# Patient Record
Sex: Male | Born: 1970 | Race: White | Hispanic: No | Marital: Married | Smoking: Never smoker
Health system: Southern US, Community
[De-identification: ages and names within clinical notes are randomized; demographics above are authoritative.]

## PROBLEM LIST (undated history)

## (undated) DIAGNOSIS — Z720 Tobacco use: Secondary | ICD-10-CM

## (undated) DIAGNOSIS — Z9889 Other specified postprocedural states: Secondary | ICD-10-CM

## (undated) DIAGNOSIS — K4090 Unilateral inguinal hernia, without obstruction or gangrene, not specified as recurrent: Secondary | ICD-10-CM

## (undated) DIAGNOSIS — Z8249 Family history of ischemic heart disease and other diseases of the circulatory system: Secondary | ICD-10-CM

## (undated) HISTORY — PX: KNEE ARTHROSCOPY W/ ACL RECONSTRUCTION: SHX1858

## (undated) HISTORY — DX: Tobacco use: Z72.0

## (undated) HISTORY — DX: Other specified postprocedural states: Z98.890

## (undated) HISTORY — DX: Family history of ischemic heart disease and other diseases of the circulatory system: Z82.49

## (undated) HISTORY — DX: Unilateral inguinal hernia, without obstruction or gangrene, not specified as recurrent: K40.90

## (undated) HISTORY — PX: KNEE ARTHROSCOPY: SUR90

## (undated) SURGERY — REPAIR, HERNIA, INGUINAL, BILATERAL, LAPAROSCOPIC
Anesthesia: General | Laterality: Bilateral

---
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
## 2010-10-28 ENCOUNTER — Ambulatory Visit: Payer: Self-pay | Admitting: Family Medicine

## 2010-11-07 ENCOUNTER — Ambulatory Visit: Payer: Self-pay | Admitting: Family Medicine

## 2016-01-25 ENCOUNTER — Emergency Department
Admission: EM | Admit: 2016-01-25 | Discharge: 2016-01-25 | Disposition: A | Discharge status: Home / Self Care | Payer: BLUE CROSS/BLUE SHIELD | Attending: Emergency Medicine | Admitting: Emergency Medicine

## 2016-01-25 ENCOUNTER — Encounter: Payer: Self-pay | Admitting: *Deleted

## 2016-01-25 DIAGNOSIS — J111 Influenza due to unidentified influenza virus with other respiratory manifestations: Secondary | ICD-10-CM | POA: Insufficient documentation

## 2016-01-25 DIAGNOSIS — R52 Pain, unspecified: Secondary | ICD-10-CM | POA: Diagnosis present

## 2016-01-25 LAB — BASIC METABOLIC PANEL
ANION GAP: 6 (ref 5–15)
BUN: 17 mg/dL (ref 6–20)
CALCIUM: 9.1 mg/dL (ref 8.9–10.3)
CO2: 26 mmol/L (ref 22–32)
Chloride: 105 mmol/L (ref 101–111)
Creatinine, Ser: 1.15 mg/dL (ref 0.61–1.24)
GLUCOSE: 113 mg/dL — AB (ref 65–99)
Potassium: 4.5 mmol/L (ref 3.5–5.1)
SODIUM: 137 mmol/L (ref 135–145)

## 2016-01-25 LAB — CBC
HCT: 48.2 % (ref 40.0–52.0)
HEMOGLOBIN: 16.4 g/dL (ref 13.0–18.0)
MCH: 32.2 pg (ref 26.0–34.0)
MCHC: 34.2 g/dL (ref 32.0–36.0)
MCV: 94.3 fL (ref 80.0–100.0)
Platelets: 211 10*3/uL (ref 150–440)
RBC: 5.11 MIL/uL (ref 4.40–5.90)
RDW: 13.1 % (ref 11.5–14.5)
WBC: 7.3 10*3/uL (ref 3.8–10.6)

## 2016-01-25 MED ORDER — OSELTAMIVIR PHOSPHATE 75 MG PO CAPS: 75.0000 mg | ORAL_CAPSULE | Freq: Two times a day (BID) | ORAL | Status: AC

## 2016-01-25 NOTE — ED Notes (Signed)
Pt then proceed to say for the past few weeks while working he has felt SOB and had a hard time getting a good deep breathe, pt in no distress at time of triage

## 2016-01-25 NOTE — ED Notes (Signed)
Pt to ed with c/o all over body aches and recent exposure to others with the flu.  Pt denies cough, denies pain at rest, but reports increased pain with movement.  VSS.  Pt wearing facemask during assessment.  In no acute resp distress at this time. Skin warm and dry.

## 2016-01-25 NOTE — ED Provider Notes (Signed)
[facility] Emergency Department Provider Note  ____________________________________________  Time seen: On arrival  I have reviewed the triage vital signs and the nursing notes.   HISTORY  Chief Complaint Generalized Body Aches    HPI Garrett Mcmahon is a 45 y.o. male who presents with complaints of myalgias, fatigue mild cough and mild sore throat. He reports he was exposed to family who recently had the flu. He denies shortness of breath. No recent travel. No nausea or vomiting.    History reviewed. No pertinent past medical history.  There are no active problems to display for this patient.   No past surgical history on file.  Current Outpatient Rx  Name  Route  Sig  Dispense  Refill  . oseltamivir (TAMIFLU) 75 MG capsule   Oral   Take 1 capsule (75 mg total) by mouth 2 (two) times daily.   10 capsule   0     Allergies Review of patient's allergies indicates no known allergies.  History reviewed. No pertinent family history.  Social History Social History  Substance Use Topics  . Smoking status: None  . Smokeless tobacco: None  . Alcohol Use: None    Review of Systems  Constitutional: Subjective fever Eyes: Negative for visual changes. ENT: Mild sore throat    Musculoskeletal: Negative for back pain. Positive for myalgias Skin: Negative for rash. Neurological: Negative for headaches or focal weakness   ____________________________________________   PHYSICAL EXAM:  VITAL SIGNS: ED Triage Vitals  Enc Vitals Group     BP 01/25/16 0938 110/71 mmHg     Pulse Rate 01/25/16 0938 86     Resp 01/25/16 0938 18     Temp 01/25/16 0938 97.6 F (36.4 C)     Temp Source 01/25/16 0938 Oral     SpO2 01/25/16 0938 99 %     Weight 01/25/16 0938 205 lb (92.987 kg)     Height 01/25/16 0938 5\' 11"  (1.803 m)     Head Cir --      Peak Flow --      Pain Score 01/25/16 0939 5     Pain Loc --      Pain Edu? --      Excl. in [place]? --       Constitutional: Alert and oriented. Well appearing and in no distress. Pleasant and interactive  Eyes: Conjunctivae are normal.  ENT   Head: Normocephalic and atraumatic.   Mouth/Throat: Mucous membranes are moist. Cardiovascular: Normal rate, regular rhythm.  Respiratory: Normal respiratory effort without tachypnea nor retractions. Clear to auscultation bilaterally Gastrointestinal: Soft and non-tender in all quadrants. No distention. There is no CVA tenderness. Musculoskeletal: Nontender with normal range of motion in all extremities. Neurologic:  Normal speech and language. No gross focal neurologic deficits are appreciated. Skin:  Skin is warm, dry and intact. No rash noted. Psychiatric: Mood and affect are normal. Patient exhibits appropriate insight and judgment.  ____________________________________________    LABS (pertinent positives/negatives)  Labs Reviewed  BASIC METABOLIC PANEL - Abnormal; Notable for the following:    Glucose, Bld 113 (*)    All other components within normal limits  CBC    ____________________________________________  ED ECG REPORT I, Lavonia Drafts, the attending physician, personally viewed and interpreted this ECG.  Date: 01/25/2016  Rate: 72 Rhythm: normal sinus rhythm QRS Axis: normal Intervals: normal ST/T Wave abnormalities: normal Conduction Disturbances: none Narrative Interpretation: unremarkable    ____________________________________________    RADIOLOGY I have personally reviewed  any xrays that were ordered on this patient: None  ____________________________________________   PROCEDURES  Procedure(s) performed: none   ____________________________________________   INITIAL IMPRESSION / ASSESSMENT AND PLAN / ED COURSE  Pertinent labs & imaging results that were available during my care of the patient were reviewed by me and considered in my medical decision making (see chart for  details).  Patient well-appearing and in no distress. Vital signs are normal. Exam is benign. Labs reassuring. History of present illness most consistent with influenza. We will treat with Tamiflu and recommend PCP follow-up. Return precautions discussed  ____________________________________________   FINAL CLINICAL IMPRESSION(S) / ED DIAGNOSES  Final diagnoses:  Influenza      Lavonia Drafts, MD 01/25/16 [phone]

## 2016-01-25 NOTE — ED Notes (Addendum)
States since Saturday pt has had no energy and body aches, denies any fever, states he visited family last week and they all had the flu

## 2016-01-25 NOTE — Discharge Instructions (Signed)

## 2016-02-23 ENCOUNTER — Emergency Department
Admission: EM | Admit: 2016-02-23 | Discharge: 2016-02-23 | Disposition: A | Discharge status: Home / Self Care | Payer: BLUE CROSS/BLUE SHIELD | Attending: Emergency Medicine | Admitting: Emergency Medicine

## 2016-02-23 ENCOUNTER — Encounter: Payer: Self-pay | Admitting: Emergency Medicine

## 2016-02-23 DIAGNOSIS — R0981 Nasal congestion: Secondary | ICD-10-CM | POA: Diagnosis present

## 2016-02-23 DIAGNOSIS — J069 Acute upper respiratory infection, unspecified: Secondary | ICD-10-CM | POA: Diagnosis not present

## 2016-02-23 MED ORDER — CHLORPHENIRAMINE MALEATE 4 MG PO TABS: 4.0000 mg | ORAL_TABLET | Freq: Two times a day (BID) | ORAL | Status: DC | PRN

## 2016-02-23 MED ORDER — GUAIFENESIN-CODEINE 100-10 MG/5ML PO SOLN: 10.0000 mL | ORAL | Status: DC | PRN

## 2016-02-23 MED ORDER — IBUPROFEN 800 MG PO TABS: 800.0000 mg | ORAL_TABLET | Freq: Three times a day (TID) | ORAL | Status: DC | PRN

## 2016-02-23 MED ORDER — AZITHROMYCIN 250 MG PO TABS: ORAL_TABLET | ORAL | Status: DC

## 2016-02-23 NOTE — ED Notes (Signed)
Reports cough and congestion.  NAD

## 2016-02-23 NOTE — ED Provider Notes (Signed)
[facility] Emergency Department Provider Note  ____________________________________________  Time seen: Approximately 10:29 AM  I have reviewed the triage vital signs and the nursing notes.   HISTORY  Chief Complaint Nasal Congestion    HPI Garrett Mcmahon is a 45 y.o. male who presents with complaints of nasal congestion and cough for the last 2 days. Patient reports no fever but has been working out in his yard and thought at first it was allergies but it continued to get worse. States his cough is productive. Complains of having a sore throat and body aches all over. Denies any fever or chills.   History reviewed. No pertinent past medical history.  There are no active problems to display for this patient.   History reviewed. No pertinent past surgical history.  Current Outpatient Rx  Name  Route  Sig  Dispense  Refill  . azithromycin (ZITHROMAX Z-PAK) 250 MG tablet      Take 2 tablets (500 mg) on  Day 1,  followed by 1 tablet (250 mg) once daily on Days 2 through 5.   6 each   0   . chlorpheniramine (CHLOR-TRIMETON) 4 MG tablet   Oral   Take 1 tablet (4 mg total) by mouth 2 (two) times daily as needed for allergies or rhinitis.   30 tablet   0   . guaiFENesin-codeine 100-10 MG/5ML syrup   Oral   Take 10 mLs by mouth every 4 (four) hours as needed for cough.   180 mL   0   . ibuprofen (ADVIL,MOTRIN) 800 MG tablet   Oral   Take 1 tablet (800 mg total) by mouth every 8 (eight) hours as needed.   30 tablet   0     Allergies Review of patient's allergies indicates no known allergies.  No family history on file.  Social History Social History  Substance Use Topics  . Smoking status: Never Smoker   . Smokeless tobacco: None  . Alcohol Use: None    Review of Systems Constitutional: No fever/chills Eyes: No visual changes. ENT: Positive sore throat. Cardiovascular: Denies chest pain. Respiratory: Denies shortness of breath.  Positive for coughing Gastrointestinal: No abdominal pain.  No nausea, no vomiting.  No diarrhea.  No constipation. Genitourinary: Negative for dysuria. Musculoskeletal: Positive for generalized body aches all over. Skin: Negative for rash. Neurological: Negative for headaches, focal weakness or numbness.  10-point ROS otherwise negative.  ____________________________________________   PHYSICAL EXAM:  VITAL SIGNS: ED Triage Vitals  Enc Vitals Group     BP 02/23/16 0935 121/71 mmHg     Pulse Rate 02/23/16 0935 57     Resp 02/23/16 0935 15     Temp 02/23/16 0935 98.3 F (36.8 C)     Temp Source 02/23/16 0935 Oral     SpO2 02/23/16 0935 100 %     Weight 02/23/16 0935 200 lb (90.719 kg)     Height 02/23/16 0935 5\' 11"  (1.803 m)     Head Cir --      Peak Flow --      Pain Score 02/23/16 0936 0     Pain Loc --      Pain Edu? --      Excl. in [place]? --     Constitutional: Alert and oriented. Well appearing and in no acute distress. Eyes: Conjunctivae are normal. PERRL. EOMI. Head: Atraumatic. Nose: Minimal congestion/rhinnorhea. Mouth/Throat: Mucous membranes are moist.  Oropharynx non-erythematous. Neck: No stridor.  Full range of  motion nontender. No cervical adenopathy noted. Cardiovascular: Normal rate, regular rhythm. Grossly normal heart sounds.  Good peripheral circulation. Respiratory: Normal respiratory effort.  No retractions. Lungs CTAB. Musculoskeletal: No lower extremity tenderness nor edema.  No joint effusions. Neurologic:  Normal speech and language. No gross focal neurologic deficits are appreciated. No gait instability. Skin:  Skin is warm, dry and intact. No rash noted. Psychiatric: Mood and affect are normal. Speech and behavior are normal.  ____________________________________________   LABS (all labs ordered are listed, but only abnormal results are displayed)  Labs Reviewed - No data to display    PROCEDURES  Procedure(s) performed:  None  Critical Care performed: No  ____________________________________________   INITIAL IMPRESSION / ASSESSMENT AND PLAN / ED COURSE  Pertinent labs & imaging results that were available during my care of the patient were reviewed by me and considered in my medical decision making (see chart for details).  Acute URI. Rx given for Robitussin-AC, chlorpheniramine, Motrin 800 mg 3 times a day. Patient follow-up with PCP or return to the ER with any worsening symptomology. Patient voices no other emergency medical complaints at this time. Work excuse 48 hours given. ____________________________________________   FINAL CLINICAL IMPRESSION(S) / ED DIAGNOSES  Final diagnoses:  Acute URI     This chart was dictated using voice recognition software/Dragon. Despite best efforts to proofread, errors can occur which can change the meaning. Any change was purely unintentional.   Arlyss Repress, PA-C 02/23/16 [place], MD 02/23/16 [phone]

## 2016-02-23 NOTE — ED Notes (Signed)
Having nasal congestion and cough for couple of days. Pt is afebrile on arrival   NAD noted

## 2016-02-23 NOTE — Discharge Instructions (Signed)
Upper Respiratory Infection, Adult Most upper respiratory infections (URIs) are a viral infection of the air passages leading to the lungs. A URI affects the nose, throat, and upper air passages. The most common type of URI is nasopharyngitis and is typically referred to as "the common cold." URIs run their course and usually go away on their own. Most of the time, a URI does not require medical attention, but sometimes a bacterial infection in the upper airways can follow a viral infection. This is called a secondary infection. Sinus and middle ear infections are common types of secondary upper respiratory infections. Bacterial pneumonia can also complicate a URI. A URI can worsen asthma and chronic obstructive pulmonary disease (COPD). Sometimes, these complications can require emergency medical care and may be life threatening.  CAUSES Almost all URIs are caused by viruses. A virus is a type of germ and can spread from one person to another.  RISKS FACTORS You may be at risk for a URI if:   You smoke.   You have chronic heart or lung disease.  You have a weakened defense (immune) system.   You are very young or very old.   You have nasal allergies or asthma.  You work in crowded or poorly ventilated areas.  You work in health care facilities or schools. SIGNS AND SYMPTOMS  Symptoms typically develop 2-3 days after you come in contact with a cold virus. Most viral URIs last 7-10 days. However, viral URIs from the influenza virus (flu virus) can last 14-18 days and are typically more severe. Symptoms may include:   Runny or stuffy (congested) nose.   Sneezing.   Cough.   Sore throat.   Headache.   Fatigue.   Fever.   Loss of appetite.   Pain in your forehead, behind your eyes, and over your cheekbones (sinus pain).  Muscle aches.  DIAGNOSIS  Your health care provider may diagnose a URI by:  Physical exam.  Tests to check that your symptoms are not due to  another condition such as:  Strep throat.  Sinusitis.  Pneumonia.  Asthma. TREATMENT  A URI goes away on its own with time. It cannot be cured with medicines, but medicines may be prescribed or recommended to relieve symptoms. Medicines may help:  Reduce your fever.  Reduce your cough.  Relieve nasal congestion. HOME CARE INSTRUCTIONS   Take medicines only as directed by your health care provider.   Gargle warm saltwater or take cough drops to comfort your throat as directed by your health care provider.  Use a warm mist humidifier or inhale steam from a shower to increase air moisture. This may make it easier to breathe.  Drink enough fluid to keep your urine clear or pale yellow.   Eat soups and other clear broths and maintain good nutrition.   Rest as needed.   Return to work when your temperature has returned to normal or as your health care provider advises. You may need to stay home longer to avoid infecting others. You can also use a face mask and careful hand washing to prevent spread of the virus.  Increase the usage of your inhaler if you have asthma.   Do not use any tobacco products, including cigarettes, chewing tobacco, or electronic cigarettes. If you need help quitting, ask your health care provider. PREVENTION  The best way to protect yourself from getting a cold is to practice good hygiene.   Avoid oral or hand contact with people with cold   symptoms.   Wash your hands often if contact occurs.  There is no clear evidence that vitamin C, vitamin E, echinacea, or exercise reduces the chance of developing a cold. However, it is always recommended to get plenty of rest, exercise, and practice good nutrition.  SEEK MEDICAL CARE IF:   You are getting worse rather than better.   Your symptoms are not controlled by medicine.   You have chills.  You have worsening shortness of breath.  You have brown or red mucus.  You have yellow or brown nasal  discharge.  You have pain in your face, especially when you bend forward.  You have a fever.  You have swollen neck glands.  You have pain while swallowing.  You have white areas in the back of your throat. SEEK IMMEDIATE MEDICAL CARE IF:   You have severe or persistent:  Headache.  Ear pain.  Sinus pain.  Chest pain.  You have chronic lung disease and any of the following:  Wheezing.  Prolonged cough.  Coughing up blood.  A change in your usual mucus.  You have a stiff neck.  You have changes in your:  Vision.  Hearing.  Thinking.  Mood. MAKE SURE YOU:   Understand these instructions.  Will watch your condition.  Will get help right away if you are not doing well or get worse.   This information is not intended to replace advice given to you by your health care provider. Make sure you discuss any questions you have with your health care provider.   Document Released: 04/19/2001 Document Revised: 03/10/2015 Document Reviewed: 01/29/2014 Elsevier Interactive Patient Education 2016 [employer]  

## 2016-06-06 ENCOUNTER — Emergency Department
Admission: EM | Admit: 2016-06-06 | Discharge: 2016-06-06 | Disposition: A | Discharge status: Home / Self Care | Payer: BLUE CROSS/BLUE SHIELD | Attending: Emergency Medicine | Admitting: Emergency Medicine

## 2016-06-06 ENCOUNTER — Encounter: Payer: Self-pay | Admitting: Emergency Medicine

## 2016-06-06 DIAGNOSIS — J069 Acute upper respiratory infection, unspecified: Secondary | ICD-10-CM | POA: Insufficient documentation

## 2016-06-06 DIAGNOSIS — J029 Acute pharyngitis, unspecified: Secondary | ICD-10-CM | POA: Diagnosis present

## 2016-06-06 MED ORDER — ONDANSETRON HCL 4 MG/2ML IJ SOLN
INTRAMUSCULAR | Status: AC
  Filled 2016-06-06: qty 2

## 2016-06-06 MED ORDER — CETIRIZINE HCL 10 MG PO CAPS: 10.0000 mg | ORAL_CAPSULE | Freq: Every day | ORAL | 3 refills | Status: DC

## 2016-06-06 MED ORDER — KETOROLAC TROMETHAMINE 30 MG/ML IJ SOLN
INTRAMUSCULAR | Status: AC
  Filled 2016-06-06: qty 1

## 2016-06-06 MED ORDER — GUAIFENESIN-CODEINE 100-10 MG/5ML PO SYRP: 5.0000 mL | ORAL_SOLUTION | Freq: Three times a day (TID) | ORAL | 0 refills | Status: DC | PRN

## 2016-06-06 NOTE — Discharge Instructions (Signed)
Rest today. Increase fluid intake. Return to the ER for symptoms that change or worsen if unable to schedule an appointment with primary care.

## 2016-06-06 NOTE — ED Triage Notes (Signed)
Pt presents with sore throat and nasal congestion for two days.

## 2016-06-06 NOTE — ED Provider Notes (Signed)
[facility] Emergency Department Provider Note  ____________________________________________  Time seen: Approximately 9:14 AM  I have reviewed the triage vital signs and the nursing notes.   HISTORY  Chief Complaint Sore Throat   HPI Garrett Mcmahon is a 45 y.o. male who presents to the emergency department for evaluation of sore throat, cough, body aches, nasal congestion, and postnasal drip x 2 days.He has taken some DayQuil with moderate relief of his symptoms.   History reviewed. No pertinent past medical history.  There are no active problems to display for this patient.   History reviewed. No pertinent surgical history.  Current Outpatient Rx  . Order #: [id number] Class: Print  . Order #: [id number] Class: Print  . Order #: [id number] Class: Print  . Order #: [id number] Class: Print  . Order #: [id number] Class: Print    Allergies Review of patient's allergies indicates no known allergies.  No family history on file.  Social History Social History  Substance Use Topics  . Smoking status: Never Smoker  . Smokeless tobacco: Not on file  . Alcohol use Not on file    Review of Systems Constitutional: No fever/chills ENT: Positive sore throat. Cardiovascular: Denies chest pain. Respiratory: Negative for shortness of breath. Positive for cough. Gastrointestinal: Negative for nausea,  no vomiting.  Negative for diarrhea.  Musculoskeletal: Positive for body aches Skin: Negative for rash. Neurological: Negative for headaches ____________________________________________   PHYSICAL EXAM:  VITAL SIGNS: ED Triage Vitals  Enc Vitals Group     BP 06/06/16 0847 117/65     Pulse Rate 06/06/16 0847 62     Resp 06/06/16 0847 18     Temp 06/06/16 0847 97.7 F (36.5 C)     Temp Source 06/06/16 0847 Oral     SpO2 06/06/16 0847 98 %     Weight 06/06/16 0847 200 lb (90.7 kg)     Height 06/06/16 0847 5\' 10"  (1.778 m)     Head Circumference --    Peak Flow --      Pain Score 06/06/16 0848 7     Pain Loc --      Pain Edu? --      Excl. in [place]? --     Constitutional: Alert and oriented. Well appearing and in no acute distress. Eyes: Conjunctivae are normal. EOMI. Ears: TM normal bilaterally. Nose: Diffuse sinus congestion; clear rhinnorhea. Mouth/Throat: Mucous membranes are moist.  Oropharynx erythematous. Tonsils appear 2+ without exudate. Neck: No stridor.  Lymphatic: No cervical lymphadenopathy. Cardiovascular: Normal rate, regular rhythm. Grossly normal heart sounds.  Good peripheral circulation. Respiratory: Normal respiratory effort.  No retractions. Clear to auscultation throughout. Gastrointestinal: Soft and nontender.  Musculoskeletal: FROM x 4 extremities.  Neurologic:  Normal speech and language.  Skin:  Skin is warm, dry and intact. No rash noted. Psychiatric: Mood and affect are normal. Speech and behavior are normal.  ____________________________________________   LABS (all labs ordered are listed, but only abnormal results are displayed)  Labs Reviewed - No data to display ____________________________________________  EKG   ____________________________________________  RADIOLOGY  Not indicated ____________________________________________   PROCEDURES  Procedure(s) performed: None  Critical Care performed: No  ____________________________________________   INITIAL IMPRESSION / ASSESSMENT AND PLAN / ED COURSE  Pertinent labs & imaging results that were available during my care of the patient were reviewed by me and considered in my medical decision making (see chart for details).   Patient was advised to take the cetirizine and Robitussin AC as prescribed. He was advised  to follow-up with primary care provider for his choice for symptoms that are not improving over the next 2-3 days. He was instructed to return to the emergency department for symptoms that change or worsen if he is unable  schedule an appointment. ____________________________________________   FINAL CLINICAL IMPRESSION(S) / ED DIAGNOSES  Final diagnoses:  URI (upper respiratory infection)    Note:  This document was prepared using Dragon voice recognition software and may include unintentional dictation errors.     Victorino Dike, FNP 06/07/16 1617    Har[place] Dark, MD 06/09/16 [phone]

## 2017-01-18 DIAGNOSIS — Z8249 Family history of ischemic heart disease and other diseases of the circulatory system: Secondary | ICD-10-CM

## 2017-01-18 DIAGNOSIS — F17200 Nicotine dependence, unspecified, uncomplicated: Secondary | ICD-10-CM | POA: Insufficient documentation

## 2017-01-18 DIAGNOSIS — K4090 Unilateral inguinal hernia, without obstruction or gangrene, not specified as recurrent: Secondary | ICD-10-CM | POA: Insufficient documentation

## 2017-01-18 HISTORY — DX: Family history of ischemic heart disease and other diseases of the circulatory system: Z82.49

## 2017-01-19 ENCOUNTER — Encounter: Payer: Self-pay | Admitting: Surgery

## 2017-01-19 ENCOUNTER — Ambulatory Visit (INDEPENDENT_AMBULATORY_CARE_PROVIDER_SITE_OTHER): Payer: BLUE CROSS/BLUE SHIELD | Admitting: Surgery

## 2017-01-19 VITALS — BP 121/85 | HR 90 | Temp 98.3°F | Wt 220.0 lb

## 2017-01-19 DIAGNOSIS — K4091 Unilateral inguinal hernia, without obstruction or gangrene, recurrent: Secondary | ICD-10-CM | POA: Diagnosis not present

## 2017-01-19 DIAGNOSIS — K4090 Unilateral inguinal hernia, without obstruction or gangrene, not specified as recurrent: Secondary | ICD-10-CM

## 2017-01-19 NOTE — Patient Instructions (Addendum)
Give [place] a call in case you decide on doing your surgery soon.  Please look at your blue sheet in case you have any questions or concerns about your surgery.

## 2017-01-19 NOTE — Progress Notes (Signed)
Garrett Mcmahon is an 46 y.o. male.   Chief Complaint: RIH HPI:   Past Medical History:  Diagnosis Date  . Chronic elbow pain, right   . Chronic left shoulder pain   . Family history of MI (myocardial infarction) 01/18/2017   Age- 56's  . Inguinal hernia 1971   Repaired at 2 months old   . Tobacco use     Past Surgical History:  Procedure Laterality Date  . INGUINAL HERNIA REPAIR Right 1971   Repaired at 73 months old  . KNEE ARTHROSCOPY Right    x2  . KNEE ARTHROSCOPY W/ ACL RECONSTRUCTION Right     Family History  Problem Relation Age of Onset  . COPD Mother   . Bipolar disorder Father   . Heart attack Maternal Grandmother   . Breast cancer Maternal Grandmother   . Heart disease Paternal Grandfather   . Heart attack Paternal Grandfather   . Lung cancer Paternal Grandfather     Smoker   Social History:  reports that he has never smoked. His smokeless tobacco use includes Chew. He reports that he does not drink alcohol or use drugs. Works at [employer]  Allergies: No Known Allergies   (Not in a hospital admission)   Review of Systems:   Review of Systems  Constitutional: Negative.   HENT: Negative.   Eyes: Negative.   Respiratory: Negative.   Cardiovascular: Negative.   Gastrointestinal: Negative.   Genitourinary: Negative.   Musculoskeletal: Negative.   Skin: Negative.   Neurological: Negative.   Endo/Heme/Allergies: Negative.   Psychiatric/Behavioral: Negative.     Physical Exam:  Physical Exam  Constitutional: He is oriented to person, place, and time and well-developed, well-nourished, and in no distress. No distress.  HENT:  Head: Normocephalic and atraumatic.  Eyes: Pupils are equal, round, and reactive to light. Right eye exhibits no discharge. Left eye exhibits no discharge. No scleral icterus.  Neck: Normal range of motion.  Cardiovascular: Normal rate, regular rhythm and normal heart sounds.   Pulmonary/Chest: Effort normal and breath sounds  normal. No respiratory distress. He has no wheezes.  Abdominal: Soft. He exhibits no distension. There is no tenderness. There is no rebound and no guarding.  Genitourinary: Penis normal.  Genitourinary Comments: Normal testicles Patient examined standing supine and with Valsalva. Large right inguinal hernia probably direct which is easily reducible and nontender Smaller left inguinal hernia but certainly palpable and nontender  Musculoskeletal: Normal range of motion. He exhibits no edema.  Lymphadenopathy:    He has no cervical adenopathy.  Neurological: He is alert and oriented to person, place, and time.  Skin: Skin is warm and dry. No rash noted. He is not diaphoretic. No erythema.  Psychiatric: Mood and affect normal.  Vitals reviewed.   There were no vitals taken for this visit.    No results found for this or any previous visit (from the past 48 hour(s)). No results found.   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 He had a repaired as a baby. I would recommend laparoscopic preperitoneal repair of bilateral inguinal hernias for control of his symptoms. The rationale for this been discussed the options of observation versus open procedure were reviewed and the risk bleeding infection recurrence ischemic orchitis chronic pain syndrome and neuroma were all discussed he understood and agreed to proceed Patient is out of vacation time and may want to delay this but he will talk to his supervisor. Florene Glen, MD, FACS

## 2017-02-09 ENCOUNTER — Encounter: Payer: Self-pay | Admitting: General Practice

## 2017-10-26 ENCOUNTER — Telehealth: Payer: Self-pay | Admitting: General Practice

## 2017-10-26 ENCOUNTER — Ambulatory Visit: Payer: Self-pay | Admitting: Surgery

## 2017-10-26 NOTE — Telephone Encounter (Signed)
Left a message for the patient to call the office, patient no showed appointment on 10/26/17 with Dr. Burt Knack for a Follow Up: Right Inguinal Hernia-discuss surgery date. Please r/s if the patient calls back.

## 2017-10-27 NOTE — Telephone Encounter (Signed)
Mailed a letter to patient to call the office to r/s her no showed appointment.

## 2018-10-23 ENCOUNTER — Encounter (HOSPITAL_COMMUNITY): Payer: Self-pay | Admitting: *Deleted

## 2018-10-23 ENCOUNTER — Other Ambulatory Visit: Payer: Self-pay

## 2018-10-23 ENCOUNTER — Ambulatory Visit (HOSPITAL_COMMUNITY)
Admission: EM | Admit: 2018-10-23 | Discharge: 2018-10-23 | Disposition: A | Discharge status: Home / Self Care | Payer: BLUE CROSS/BLUE SHIELD | Attending: Family Medicine | Admitting: Family Medicine

## 2018-10-23 DIAGNOSIS — J40 Bronchitis, not specified as acute or chronic: Secondary | ICD-10-CM | POA: Insufficient documentation

## 2018-10-23 MED ORDER — HYDROCODONE-HOMATROPINE 5-1.5 MG/5ML PO SYRP: 5.0000 mL | ORAL_SOLUTION | Freq: Four times a day (QID) | ORAL | 0 refills | Status: DC | PRN

## 2018-10-23 MED ORDER — PREDNISONE 20 MG PO TABS: ORAL_TABLET | ORAL | 0 refills | Status: DC

## 2018-10-23 NOTE — ED Triage Notes (Signed)
C/o cough x 1 week.

## 2018-10-23 NOTE — Discharge Instructions (Signed)
Please return if you are not significantly better in 48 hours or if you start running a fever.

## 2018-10-23 NOTE — ED Provider Notes (Signed)
[place]    CSN: [id number] Arrival date & time: 10/23/18  1305     History   Chief Complaint Chief Complaint  Patient presents with  . Cough    HPI Garrett Mcmahon is a 47 y.o. male.   This is an initial visit for this 47 year old gentleman.  He comes in complaining with cough x 1 week.  He has had no smoking  or history of asthma.  His cough is somewhat productive and worse at night.  He has had no fever.  Patient has no shortness of breath or chest pain.     Past Medical History:  Diagnosis Date  . Chronic elbow pain, right   . Chronic left shoulder pain   . Family history of MI (myocardial infarction) 01/18/2017   Age- 55's  . Inguinal hernia 1971   Repaired at 2 months old   . Tobacco use     Patient Active Problem List   Diagnosis Date Noted  . Right inguinal hernia 01/18/2017  . Tobacco use disorder 01/18/2017  . Family history of MI (myocardial infarction) 01/18/2017    Past Surgical History:  Procedure Laterality Date  . INGUINAL HERNIA REPAIR Right 1971   Repaired at 88 months old  . KNEE ARTHROSCOPY Right    x2  . KNEE ARTHROSCOPY W/ ACL RECONSTRUCTION Right        Home Medications    Prior to Admission medications   Medication Sig Start Date End Date Taking? Authorizing Provider  acetaminophen (TYLENOL) 325 MG tablet Take 650 mg by mouth every 6 (six) hours as needed.    [provider]  HYDROcodone-homatropine (HYDROMET) 5-1.5 MG/5ML syrup Take 5 mLs by mouth every 6 (six) hours as needed for cough. 10/23/18   Robyn Haber, MD  predniSONE (DELTASONE) 20 MG tablet Two daily with food 10/23/18   Robyn Haber, MD    Family History Family History  Problem Relation Age of Onset  . COPD Mother   . Bipolar disorder Father   . Alcohol abuse Father   . Heart attack Maternal Grandmother   . Breast cancer Maternal Grandmother   . Heart disease Maternal Grandmother   . Heart disease Paternal Grandfather   . Heart  attack Paternal Grandfather   . Lung cancer Paternal Grandfather        Smoker  . Cancer Paternal Grandfather        Lung  . Heart disease Maternal Uncle   . Cancer Paternal Grandmother        breast    Social History Social History   Tobacco Use  . Smoking status: Never Smoker  . Smokeless tobacco: Current User    Types: Chew  Substance Use Topics  . Alcohol use: No  . Drug use: No     Allergies   Patient has no known allergies.   Review of Systems Review of Systems   Physical Exam Triage Vital Signs ED Triage Vitals  Enc Vitals Group     BP 10/23/18 1341 128/87     Pulse Rate 10/23/18 1341 77     Resp 10/23/18 1341 16     Temp 10/23/18 1341 97.9 F (36.6 C)     Temp Source 10/23/18 1341 Oral     SpO2 10/23/18 1341 97 %     Weight --      Height --      Head Circumference --      Peak Flow --  Pain Score 10/23/18 1343 0     Pain Loc --      Pain Edu? --      Excl. in [place]? --    No data found.  Updated Vital Signs BP 128/87 (BP Location: Right Arm)   Pulse 77   Temp 97.9 F (36.6 C) (Oral)   Resp 16   SpO2 97%    Physical Exam Vitals signs and nursing note reviewed.  Constitutional:      General: He is not in acute distress.    Appearance: Normal appearance. He is not ill-appearing.  HENT:     Head: Normocephalic.     Right Ear: Tympanic membrane and external ear normal.     Left Ear: Tympanic membrane and external ear normal.     Nose: Nose normal.     Mouth/Throat:     Mouth: Mucous membranes are moist.  Eyes:     Conjunctiva/sclera: Conjunctivae normal.  Neck:     Musculoskeletal: Normal range of motion.  Cardiovascular:     Rate and Rhythm: Normal rate.     Heart sounds: Normal heart sounds.  Pulmonary:     Effort: Pulmonary effort is normal.     Breath sounds: Rhonchi present. No wheezing.  Musculoskeletal: Normal range of motion.  Skin:    General: Skin is warm and dry.  Neurological:     General: No focal deficit  present.     Mental Status: He is alert.      UC Treatments / Results  Labs (all labs ordered are listed, but only abnormal results are displayed) Labs Reviewed - No data to display  EKG None  Radiology No results found.  Procedures Procedures (including critical care time)  Medications Ordered in UC Medications - No data to display  Initial Impression / Assessment and Plan / UC Course  I have reviewed the triage vital signs and the nursing notes.  Pertinent labs & imaging results that were available during my care of the patient were reviewed by me and considered in my medical decision making (see chart for details).    Final Clinical Impressions(s) / UC Diagnoses   Final diagnoses:  Bronchitis     Discharge Instructions     Please return if you are not significantly better in 48 hours or if you start running a fever.    ED Prescriptions    Medication Sig Dispense Auth. Provider   predniSONE (DELTASONE) 20 MG tablet Two daily with food 10 tablet Robyn Haber, MD   HYDROcodone-homatropine (HYDROMET) 5-1.5 MG/5ML syrup Take 5 mLs by mouth every 6 (six) hours as needed for cough. 60 mL Robyn Haber, MD     Controlled Substance Prescriptions [place] Controlled Substance Registry consulted? Not Applicable   Robyn Haber, MD 10/23/18 1404

## 2018-12-24 ENCOUNTER — Other Ambulatory Visit: Payer: Self-pay

## 2018-12-24 ENCOUNTER — Encounter: Payer: Self-pay | Admitting: Family Medicine

## 2018-12-24 ENCOUNTER — Other Ambulatory Visit: Payer: Self-pay | Admitting: Family Medicine

## 2018-12-24 ENCOUNTER — Ambulatory Visit (INDEPENDENT_AMBULATORY_CARE_PROVIDER_SITE_OTHER): Payer: BLUE CROSS/BLUE SHIELD | Admitting: Family Medicine

## 2018-12-24 VITALS — BP 118/81 | HR 79 | Temp 97.7°F | Resp 16 | Ht 71.0 in | Wt 226.8 lb

## 2018-12-24 DIAGNOSIS — F17200 Nicotine dependence, unspecified, uncomplicated: Secondary | ICD-10-CM

## 2018-12-24 DIAGNOSIS — Z125 Encounter for screening for malignant neoplasm of prostate: Secondary | ICD-10-CM

## 2018-12-24 DIAGNOSIS — Z114 Encounter for screening for human immunodeficiency virus [HIV]: Secondary | ICD-10-CM

## 2018-12-24 DIAGNOSIS — Z0000 Encounter for general adult medical examination without abnormal findings: Secondary | ICD-10-CM

## 2018-12-24 DIAGNOSIS — R7309 Other abnormal glucose: Secondary | ICD-10-CM

## 2018-12-24 DIAGNOSIS — E669 Obesity, unspecified: Secondary | ICD-10-CM

## 2018-12-24 DIAGNOSIS — Z7689 Persons encountering health services in other specified circumstances: Secondary | ICD-10-CM | POA: Diagnosis not present

## 2018-12-24 DIAGNOSIS — H65492 Other chronic nonsuppurative otitis media, left ear: Secondary | ICD-10-CM

## 2018-12-24 DIAGNOSIS — H6982 Other specified disorders of Eustachian tube, left ear: Secondary | ICD-10-CM

## 2018-12-24 DIAGNOSIS — K4021 Bilateral inguinal hernia, without obstruction or gangrene, recurrent: Secondary | ICD-10-CM | POA: Diagnosis not present

## 2018-12-24 DIAGNOSIS — E66811 Obesity, class 1: Secondary | ICD-10-CM

## 2018-12-24 DIAGNOSIS — Z9889 Other specified postprocedural states: Secondary | ICD-10-CM | POA: Diagnosis not present

## 2018-12-24 MED ORDER — PREDNISONE 20 MG PO TABS: 20.0000 mg | ORAL_TABLET | Freq: Every day | ORAL | 0 refills | Status: DC

## 2018-12-24 MED ORDER — FLUTICASONE PROPIONATE 50 MCG/ACT NA SUSP: 2.0000 | Freq: Every day | NASAL | 3 refills | Status: DC

## 2018-12-24 NOTE — Assessment & Plan Note (Signed)
Continues daily dip smokeless tobacco Follow-up as needed when ready for counseling cessation

## 2018-12-24 NOTE — Assessment & Plan Note (Addendum)
Stable chronic problem since childhood Without report of complication  Reassurance given no symptomatic episodes or complications Trial of Truss OTC prevent worsening symptoms and herniation Follow-up at next visit -for closer exam, and future consider refer Gen Surg for possible repair when ready Strict return criteria given if worsening episode when to go to hospital

## 2018-12-24 NOTE — Progress Notes (Signed)
Subjective:    Patient ID: Garrett Mcmahon, male    DOB: [birth year], 48 y.o.   MRN: [id number]  Garrett Mcmahon is a 48 y.o. male presenting on 12/24/2018 for Establish Care (ear infection left ear intermitten since 11/19) and Shoulder Pain (s/p L shoulder rotator cuff repair)  No previous PCP recently. Here to establish care.  HPI   S/p Left Shoulder Surgery, rotator cuff repair Prior history reviewed. He works for Programmer, multimedia, he was working with [employer], he had injury on the job, seen by [employer] Dr Amparo Bristol, then worker's comp switched him to Dr Noemi Chapel ([facility]) and he was seen and had surgery in 11/21/18, L shoulder rotator cuff repair - for medicine, he has Oxycodone 5 IR but has never taken - Taking Cyclobenzaprine 5mg  PRN muscle relaxant - Taking Celebrex 200mg  PRN pain Denies any significant worsening pain, swelling, other joint problem, injury trauma fall  L Ear Eustachian Tube Dysfunction w/ Effusion / Reduced Hearing / History of Left Ear Infection Reports recent history in December 2019 he had L ear infection, with pain, seen at Urgent Care and treated with antibiotic course, and then symptoms returned, and he was treated again with different antibiotic and again worked with good results. But it never seemed to resolve. 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 He has some protrusion without any pain or obstruction at times, worse if cough or strain he can feel it, but it is easily reducible. 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  Health Maintenance Due for routine HIV screen, has had before, will get with next lab No known fam history of colon or prostate cancer. Interested in cologuard, and PSA lab test. Declines flu vaccine.  Depression screen PHQ 2/9 12/24/2018  Decreased Interest 0  Down, Depressed, Hopeless 0  PHQ - 2 Score 0    Past Medical History:  Diagnosis Date  . Family history of MI (myocardial infarction) 01/18/2017   Age- 2's  . H/O shoulder surgery   . Inguinal hernia 1971   Repaired at 2 months old   . Tobacco use    Past Surgical History:  Procedure Laterality Date  . INGUINAL HERNIA REPAIR Right 1971   Repaired at 14 months old  . KNEE ARTHROSCOPY Right    x2  . KNEE ARTHROSCOPY W/ ACL RECONSTRUCTION Right    Social History   Socioeconomic History  . Marital status: Married    Spouse name: Not on file  . Number of children: Not on file  . Years of education: [employer]  . Highest education level: High school graduate  Occupational History  . 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 Financial resource strain: Not on file  . Food insecurity:    Worry: Not on file    Inability: Not on file  . Transportation needs:    Medical: Not on file  Non-medical: Not on file  Tobacco Use  . Smoking status: Never Smoker  . Smokeless tobacco: Current User    Types: Chew  . Tobacco comment: 1 can dip per day for 26+ years  Substance and Sexual Activity  . Alcohol use: No  . Drug use: No  . Sexual activity: Not on file  Lifestyle  . Physical activity:    Days per week: Not on file    Minutes per session: Not on file  . Stress: Not on file  Relationships  . Social connections:    Talks on phone: Not on file    Gets together: Not on file    Attends religious service: Not on file    Active member of club or organization:  Not on file    Attends meetings of clubs or organizations: Not on file    Relationship status: Not on file  . Intimate partner violence:    Fear of current or ex partner: Not on file    Emotionally abused: Not on file    Physically abused: Not on file    Forced sexual activity: Not on file  Other Topics Concern  . Not on file  Social History Narrative  . Not on file   Family History  Problem Relation Age of Onset  . COPD Mother   . Bipolar disorder Father   . Alcohol abuse Father   . Heart attack Maternal Grandmother   . Breast cancer Maternal Grandmother   . Heart disease Maternal Grandmother   . Heart disease Paternal Grandfather   . Heart attack Paternal Grandfather   . Lung cancer Paternal Grandfather        Smoker  . Cancer Paternal Grandfather        Lung  . Heart disease Maternal Uncle   . Cancer Paternal Grandmother        breast  . Prostate cancer Neg Hx   . Colon cancer Neg Hx    Current Outpatient Medications on File Prior to Visit  Medication Sig  . acetaminophen (TYLENOL) 325 MG tablet Take 650 mg by mouth every 6 (six) hours as needed.  . celecoxib (CELEBREX) 200 MG capsule PLEASE SEE ATTACHED FOR DETAILED DIRECTIONS  . cyclobenzaprine (FLEXERIL) 5 MG tablet    No current facility-administered medications on file prior to visit.     Review of Systems Per HPI unless specifically indicated above     Objective:    BP 118/81   Pulse 79   Temp 97.7 F (36.5 C) (Oral)   Resp 16   Ht 5\' 11"  (1.803 m)   Wt 226 lb 12.8 oz (102.9 kg)   BMI 31.63 kg/m   Wt Readings from Last 3 Encounters:  12/24/18 226 lb 12.8 oz (102.9 kg)  01/19/17 220 lb (99.8 kg)  06/06/16 200 lb (90.7 kg)    Physical Exam Vitals signs and nursing note reviewed.  Constitutional:      General: He is not in acute distress.    Appearance: He is well-developed. He is not diaphoretic.     Comments: Well-appearing, comfortable, cooperative  HENT:     Head: Normocephalic and  atraumatic.     Comments: Frontal / maxillary sinuses non-tender. Nares patent without purulence or edema. R TMs clear without erythema, effusion or bulging. L TM with some fullness and slightly opaque to clear effusion noticeable, no erythema, some abnormal TM anatomy but does not appear perforation. Oropharynx clear without erythema, exudates, edema or asymmetry. Eyes:  General:        Right eye: No discharge.        Left eye: No discharge.     Conjunctiva/sclera: Conjunctivae normal.  Neck:     Musculoskeletal: Normal range of motion and neck supple.     Thyroid: No thyromegaly.  Cardiovascular:     Rate and Rhythm: Normal rate and regular rhythm.     Heart sounds: Normal heart sounds. No murmur.  Pulmonary:     Effort: Pulmonary effort is normal. No respiratory distress.     Breath sounds: Normal breath sounds. No wheezing or rales.  Musculoskeletal:     Comments: Left upper extremity in sling, not evaluated.  Lymphadenopathy:     Cervical: No cervical adenopathy.  Skin:    General: Skin is warm and dry.     Findings: No erythema or rash.  Neurological:     Mental Status: He is alert and oriented to person, place, and time.  Psychiatric:        Behavior: Behavior normal.     Comments: Well groomed, good eye contact, normal speech and thoughts    Results for orders placed or performed during the hospital encounter of 16/01/09  Basic metabolic panel  Result Value Ref Range   Sodium 137 135 - 145 mmol/L   Potassium 4.5 3.5 - 5.1 mmol/L   Chloride 105 101 - 111 mmol/L   CO2 26 22 - 32 mmol/L   Glucose, Bld 113 (H) 65 - 99 mg/dL   BUN 17 6 - 20 mg/dL   Creatinine, Ser 1.15 0.61 - 1.24 mg/dL   Calcium 9.1 8.9 - 10.3 mg/dL   GFR calc non Af Amer >60 >60 mL/min   GFR calc Af Amer >60 >60 mL/min   Anion gap 6 5 - 15  CBC  Result Value Ref Range   WBC 7.3 3.8 - 10.6 K/uL   RBC 5.11 4.40 - 5.90 MIL/uL   Hemoglobin 16.4 13.0 - 18.0 g/dL   HCT 48.2 40.0 - 52.0 %   MCV 94.3  80.0 - 100.0 fL   MCH 32.2 26.0 - 34.0 pg   MCHC 34.2 32.0 - 36.0 g/dL   RDW 13.1 11.5 - 14.5 %   Platelets 211 150 - 440 K/uL      Assessment & Plan:   Problem List Items Addressed This Visit    Bilateral inguinal hernia, without obstruction or gangrene, recurrent    Stable chronic problem since childhood Without report of complication  Reassurance given no symptomatic episodes or complications Trial of Truss OTC prevent worsening symptoms and herniation Follow-up at next visit -for closer exam, and future consider refer Gen Surg for possible repair when ready Strict return criteria given if worsening episode when to go to hospital      Obesity (BMI 30.0-34.9) - Primary    Stable weight Encourage improve lifestyle diet exercise      S/P rotator cuff surgery    Followed by Dr Noemi Chapel [facility]) worker's comp S/p surgery 11/2018 Request records  Contiues on NSAID, muscle relaxant PRN      Tobacco use disorder    Continues daily dip smokeless tobacco Follow-up as needed when ready for counseling cessation       Other Visit Diagnoses    Encounter to establish care with new doctor       Eustachian tube dysfunction, left       Relevant Medications   fluticasone (FLONASE) 50 MCG/ACT nasal spray   predniSONE (  DELTASONE) 20 MG tablet   Chronic MEE (middle ear effusion), left       Relevant Medications   fluticasone (FLONASE) 50 MCG/ACT nasal spray   predniSONE (DELTASONE) 20 MG tablet      Subacute on chronic >1-2 months, L eustachian tube dysfunction with secondary L ear effusion, with mild hearing loss. Resolved AOM. No sign of infection at this time, completed antibiotics  Plan: 1. Start Flonase 2 sprays each nare daily for up to 4-6 weeks or longer 2. Start OTC oral anti-histamine daily 3. Recommend may consider trial OTC oral decongestant for up to 1 week or less 4. Recommended per [facility]S Galbreath Technique today. Advised can use warm moist heat in his  area and may try this technique as demonstrated 1-2x daily 2-3 days then stop, avoid over-use 5. Additionally discussed potential benefit of oral steroid, prednisone for short course up to 20mg  daily x 5 days 6. Follow-up if not improved or worsening, return criteria  Next may need ENT if not resolved   Meds ordered this encounter  Medications  . fluticasone (FLONASE) 50 MCG/ACT nasal spray    Sig: Place 2 sprays into both nostrils daily. Use for 4-6 weeks then stop and use seasonally or as needed.    Dispense:  16 g    Refill:  3  . predniSONE (DELTASONE) 20 MG tablet    Sig: Take 1 tablet (20 mg total) by mouth daily with breakfast. For 5 days    Dispense:  5 tablet    Refill:  0    Follow up plan: Return in about 2 months (around 02/22/2019) for Annual Physical.   Future physical labs 03/07/19  Nobie Putnam, [facility] 12/24/2018, 10:36 AM

## 2018-12-24 NOTE — Patient Instructions (Addendum)
Thank you for coming to the office today.  Keep with Orthopedics - we will request record from Dr Noemi Chapel  ---------------  Garrett Mcmahon have some Eustachian Tube Dysfunction, this problem is usually caused by some deeper sinus swelling and pressure, causing difficulty of eustachian tubes to clear fluid from behind ear drum. You can have ear pain, pressure, fullness, loss of hearing. Often related to sinus symptoms and sometimes with sinusitis or infection or allergy symptoms.  Treatment: - Repeat prednisone trial for steroid reduce swelling - Start using nasal steroid spray, Flonase 2 sprays in each nostril every day for at least 4-6 weeks, and maybe longer - MAY start OTC allergy medicine (Claritin, Zyrtec, or Allegra - or generics) once daily - Recommend BEHIND THE COUNTER - Sudafed decongestant as needed for a week or more - AVOID Afrin nasal spray, if you use this do not use more than 3 days or can make sinus swelling worse  Look into "Galbreath Technique" for ear effusion, can do this simple massage 1-2 x daily for few days then stop. And use as needed. May also use a warm moist wash cloth to help loosen up this tissue before or after doing this to help open up eustachian tube.   If any significant worsening, loss of hearing, constant pain, fever/chills, or concern for infection - notify office and we can send in an antibiotic. Or if just persistent pressure that is not improving, you may contact me back within 1 week and we can consider a brief course of oral steroid prednisone for 3 days only to help reduce swelling, as discussed this is not ideal treatment and can cause side effects.  -------------------------------------  You most likely have an Right > Left Inguinal Hernia.  This is caused by a weakness in your abdominal or groin muscles, and is caused by bowel or fatty tissue pushing through this weak spot causing pain and bulging.  Recommend a "Hernia Truss", try to wear this regularly  (do not need to wear to bed if pain is improved), until it feels better and then only with activities - If it is not improving then you may need to wear it every day, especially if you cannot have a surgery to fix the hernia  You can find a Truss OTC at [facility] or pharmacy  Try to find positions that give you most relief, likely laying down with head lower than body will allow the hernia bulging to go back into place and feel better.  May take Tylenol as needed. Recommend to start taking Tylenol Extra Strength 582m tabs - take 1 to 2 tabs per dose (max 10066m every 6-8 hours for pain (take regularly, don't skip a dose for next 7 days), max 24 hour daily dose is 6 tablets or 300074mIn the future you can repeat the same everyday Tylenol course for 1-2 weeks at a time.   Can try topical ice packs or muscle rub if burning nerve sensation.  If significant worsening pain or you get bulging that does NOT go down or go away and CANNOT push back in, or nausea, vomiting, then it is very important to go directly to hospital ED for more immediate evaluation, as this can be a life threatening surgical emergency  DUE for FASTING BLOOD WORK (no food or drink after midnight before the lab appointment, only water or coffee without cream/sugar on the morning of)  SCHEDULE "Lab Only" visit in the morning at the clinic for lab draw in  [place]  2-3  - Make sure Lab Only appointment is at about 1 week before your next appointment, so that results will be available  For Lab Results, once available within 2-3 days of blood draw, you can can log in to MyChart online to view your results and a brief explanation. Also, we can discuss results at next follow-up visit.  ----------  Colon Cancer Screening: - For all adults age 12+ routine colon cancer screening is highly recommended.     - Recent guidelines from [place] recommend starting age of 45 - Early detection of colon cancer is important,  because often there are no warning signs or symptoms, also if found early usually it can be cured. Late stage is hard to treat.  - If you are not interested in Colonoscopy screening (if done and normal you could be cleared for 5 to 10 years until next due), then Cologuard is an excellent alternative for screening test for Colon Cancer. It is highly sensitive for detecting DNA of colon cancer from even the earliest stages. Also, there is NO bowel prep required. - If Cologuard is NEGATIVE, then it is good for 3 years before next due - If Cologuard is POSITIVE, then it is strongly advised to get a Colonoscopy, which allows the GI doctor to locate the source of the cancer or polyp (even very early stage) and treat it by removing it. ------------------------- If you would like to proceed with Cologuard (stool DNA test) - FIRST, call your insurance company and tell them you want to check cost of Cologuard tell them CPT Code [phone] (it may be completely covered and you could get for no cost, OR max cost without any coverage is about $600). Also, keep in mind if you do NOT open the kit, and decide not to do the test, you will NOT be charged, you should contact the company if you decide not to do the test. - If you want to proceed, you can notify us (phone message, [place], or at next visit) and we will order it for you. The test kit will be delivered to you house within about 1 week. Follow instructions to collect sample, you may call the company for any help or questions, 24/7 telephone support at [phone].   Please schedule a Follow-up Appointment to: Return in about 2 months (around 02/22/2019) for Annual Physical.  If you have any other questions or concerns, please feel free to call the office or send a message through [place]. You may also schedule an earlier appointment if necessary.  Additionally, you may be receiving a survey about your experience at our office within a few days to 1 week  by e-mail or mail. We value your feedback.  Garrett Putnam, DO [place]

## 2018-12-24 NOTE — Assessment & Plan Note (Signed)
Stable weight Encourage improve lifestyle diet exercise

## 2018-12-24 NOTE — Assessment & Plan Note (Signed)
Followed by Dr Noemi Chapel [facility]) worker's comp S/p surgery 11/2018 Request records  Contiues on NSAID, muscle relaxant PRN

## 2019-03-07 ENCOUNTER — Other Ambulatory Visit: Payer: BLUE CROSS/BLUE SHIELD

## 2019-03-14 ENCOUNTER — Encounter: Payer: BLUE CROSS/BLUE SHIELD | Admitting: Family Medicine

## 2019-04-03 ENCOUNTER — Other Ambulatory Visit: Payer: BLUE CROSS/BLUE SHIELD

## 2019-04-05 LAB — CBC WITH DIFFERENTIAL/PLATELET
Absolute Monocytes: 776 cells/uL (ref 200–950)
Basophils Absolute: 40 cells/uL (ref 0–200)
Basophils Relative: 0.5 %
Eosinophils Absolute: 312 cells/uL (ref 15–500)
Eosinophils Relative: 3.9 %
HCT: 47.2 % (ref 38.5–50.0)
Hemoglobin: 16.2 g/dL (ref 13.2–17.1)
Lymphs Abs: 3000 cells/uL (ref 850–3900)
MCH: 32.7 pg (ref 27.0–33.0)
MCHC: 34.3 g/dL (ref 32.0–36.0)
MCV: 95.2 fL (ref 80.0–100.0)
MPV: 10.7 fL (ref 7.5–12.5)
Monocytes Relative: 9.7 %
Neutro Abs: 3872 cells/uL (ref 1500–7800)
Neutrophils Relative %: 48.4 %
Platelets: 236 10*3/uL (ref 140–400)
RBC: 4.96 10*6/uL (ref 4.20–5.80)
RDW: 12.8 % (ref 11.0–15.0)
Total Lymphocyte: 37.5 %
WBC: 8 10*3/uL (ref 3.8–10.8)

## 2019-04-05 LAB — HEMOGLOBIN A1C
Hgb A1c MFr Bld: 5.3 % of total Hgb (ref <5.7)
Mean Plasma Glucose: 105 (calc)
eAG (mmol/L): 5.8 (calc)

## 2019-04-05 LAB — COMPLETE METABOLIC PANEL WITH GFR
AG Ratio: 1.6 (calc) (ref 1.0–2.5)
ALT: 15 U/L (ref 9–46)
AST: 13 U/L (ref 10–40)
Albumin: 4.2 g/dL (ref 3.6–5.1)
Alkaline phosphatase (APISO): 56 U/L (ref 36–130)
BUN: 14 mg/dL (ref 7–25)
CO2: 25 mmol/L (ref 20–32)
Calcium: 9.5 mg/dL (ref 8.6–10.3)
Chloride: 104 mmol/L (ref 98–110)
Creat: 1.13 mg/dL (ref 0.60–1.35)
GFR, Est African American: 88 mL/min/{1.73_m2} (ref >=60)
GFR, Est Non African American: 76 mL/min/{1.73_m2} (ref >=60)
Globulin: 2.6 g/dL (calc) (ref 1.9–3.7)
Glucose, Bld: 82 mg/dL (ref 65–99)
Potassium: 4.2 mmol/L (ref 3.5–5.3)
Sodium: 140 mmol/L (ref 135–146)
Total Bilirubin: 0.7 mg/dL (ref 0.2–1.2)
Total Protein: 6.8 g/dL (ref 6.1–8.1)

## 2019-04-05 LAB — LIPID PANEL
Cholesterol: 209 mg/dL — ABNORMAL HIGH (ref <200)
HDL: 50 mg/dL (ref >=40)
LDL Cholesterol (Calc): 135 mg/dL (calc) — ABNORMAL HIGH
Non-HDL Cholesterol (Calc): 159 mg/dL (calc) — ABNORMAL HIGH (ref <130)
Total CHOL/HDL Ratio: 4.2 (calc) (ref <5.0)
Triglycerides: 127 mg/dL (ref <150)

## 2019-04-05 LAB — PSA: PSA: 0.2 ng/mL (ref <=4.0)

## 2019-04-05 LAB — HIV ANTIBODY (ROUTINE TESTING W REFLEX): HIV 1&2 Ab, 4th Generation: NONREACTIVE

## 2019-04-10 ENCOUNTER — Other Ambulatory Visit: Payer: Self-pay

## 2019-04-10 ENCOUNTER — Ambulatory Visit (INDEPENDENT_AMBULATORY_CARE_PROVIDER_SITE_OTHER): Payer: BLUE CROSS/BLUE SHIELD | Admitting: Family Medicine

## 2019-04-10 ENCOUNTER — Encounter: Payer: Self-pay | Admitting: Family Medicine

## 2019-04-10 ENCOUNTER — Other Ambulatory Visit: Payer: Self-pay | Admitting: Family Medicine

## 2019-04-10 VITALS — BP 103/66 | HR 72 | Temp 97.9°F | Ht 71.0 in | Wt 229.0 lb

## 2019-04-10 DIAGNOSIS — Z1211 Encounter for screening for malignant neoplasm of colon: Secondary | ICD-10-CM | POA: Diagnosis not present

## 2019-04-10 DIAGNOSIS — E669 Obesity, unspecified: Secondary | ICD-10-CM

## 2019-04-10 DIAGNOSIS — Z0000 Encounter for general adult medical examination without abnormal findings: Secondary | ICD-10-CM

## 2019-04-10 DIAGNOSIS — E7800 Pure hypercholesterolemia, unspecified: Secondary | ICD-10-CM

## 2019-04-10 DIAGNOSIS — E785 Hyperlipidemia, unspecified: Secondary | ICD-10-CM | POA: Insufficient documentation

## 2019-04-10 NOTE — Progress Notes (Signed)
Subjective:    Patient ID: Garrett Mcmahon, male    DOB: 1971-04-02, 48 y.o.   MRN: [id number]  Garrett Mcmahon is a 48 y.o. male presenting on 04/10/2019 for Annual Exam   HPI   Here for Annual Physical and Lab Review  LIFESTYLE / WELLNESS / HYPERLIPIDEMIA / OBESITY BMI >31 - Reports no new concerns. Last lipid panel 03/2019, mostly normal except mild elevated LDL - Never on cholesterol med Lifestyle - Admits few lb weight gain in >3-6 months, attributed to some change in lifestyle with coronavirus pandemic - Diet: not following strict diet, he eats mostly chicken, fairly balanced - Exercise: improving exercise plans   PMH: - S/p Left Shoulder Surgery, rotator cuff repair. He remains out of work still on leave due to this surgery. 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 Currently asymptomatic. No known family history of prostate CA.   Colon CA Screening: Never had colonoscopy. Currently asymptomatic. No known family history of colon CA. Due for screening test Cologuard, counseling given    Depression screen [facility] 2/9 12/24/2018  Decreased Interest 0  Down, Depressed, Hopeless 0  PHQ - 2 Score 0    Past Medical History:  Diagnosis Date  . Family history of MI (myocardial infarction) 01/18/2017   Age- 5's  . H/O shoulder surgery   . Inguinal hernia 1971   Repaired at 2 months old   . Tobacco use    Past Surgical History:  Procedure Laterality Date  . INGUINAL HERNIA REPAIR Right 1971   Repaired at 8 months old  . KNEE ARTHROSCOPY Right    x2  . KNEE ARTHROSCOPY W/ ACL RECONSTRUCTION Right    Social History   Socioeconomic History  . Marital status: Married    Spouse name: Not on file  . Number of children: Not on file  . Years of education: [employer]  . Highest education level: High school  graduate  Occupational History  . 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 Financial resource strain: Not on file  . Food insecurity:    Worry: Not on file    Inability: Not on file  . Transportation needs:    Medical: Not on file    Non-medical: Not on file  Tobacco Use  . Smoking status: Never Smoker  . Smokeless tobacco: Current User    Types: Chew  . Tobacco comment: 1 can dip per day for 26+ years  Substance and Sexual Activity  . Alcohol use: No  . Drug use: No  . Sexual activity: Not on file  Lifestyle  . Physical activity:    Days per week: Not on file    Minutes per session: Not on file  . Stress: Not on file  Relationships  . Social connections:    Talks on phone: Not on file    Gets together: Not on file    Attends religious service: Not on file    Active member of club or organization: Not on file    Attends meetings of clubs or organizations: Not on file    Relationship status: Not on file  . Intimate partner violence:    Fear of current or ex partner: Not on file    Emotionally abused: Not on file    Physically abused: Not on file  Forced sexual activity: Not on file  Other Topics Concern  . Not on file  Social History Narrative  . Not on file   Family History  Problem Relation Age of Onset  . COPD Mother   . Bipolar disorder Father   . Alcohol abuse Father   . Heart attack Maternal Grandmother   . Breast cancer Maternal Grandmother   . Heart disease Maternal Grandmother   . Heart disease Paternal Grandfather   . Heart attack Paternal Grandfather   . Lung cancer Paternal Grandfather        Smoker  . Cancer Paternal Grandfather        Lung  . Heart disease Maternal Uncle   . Cancer Paternal Grandmother        breast  . Prostate cancer Neg Hx   . Colon cancer Neg Hx    Current Outpatient Medications on File Prior to Visit  Medication Sig  . acetaminophen (TYLENOL) 325 MG tablet Take 650 mg by mouth every 6 (six) hours as needed.   . celecoxib (CELEBREX) 200 MG capsule PLEASE SEE ATTACHED FOR DETAILED DIRECTIONS  . fluticasone (FLONASE) 50 MCG/ACT nasal spray Place 2 sprays into both nostrils daily. Use for 4-6 weeks then stop and use seasonally or as needed.   No current facility-administered medications on file prior to visit.     Review of Systems  Constitutional: Negative for activity change, appetite change, chills, diaphoresis, fatigue and fever.  HENT: Negative for congestion and hearing loss.   Eyes: Negative for visual disturbance.  Respiratory: Negative for apnea, cough, chest tightness, shortness of breath and wheezing.   Cardiovascular: Negative for chest pain, palpitations and leg swelling.  Gastrointestinal: Negative for abdominal pain, anal bleeding, blood in stool, constipation, diarrhea, nausea and vomiting.  Endocrine: Negative for cold intolerance.  Genitourinary: Negative for decreased urine volume, difficulty urinating, dysuria, frequency and hematuria.  Musculoskeletal: Negative for arthralgias, back pain and neck pain.  Skin: Negative for rash.  Allergic/Immunologic: Negative for environmental allergies.  Neurological: Negative for dizziness, weakness, light-headedness, numbness and headaches.  Hematological: Negative for adenopathy.  Psychiatric/Behavioral: Negative for behavioral problems, dysphoric mood and sleep disturbance. The patient is not nervous/anxious.    Per HPI unless specifically indicated above     Objective:    BP 103/66 (BP Location: Left Arm, Patient Position: Sitting, Cuff Size: Normal)   Pulse 72   Temp 97.9 F (36.6 C) (Oral)   Ht 5\' 11"  (1.803 m)   Wt 229 lb (103.9 kg)   BMI 31.94 kg/m   Wt Readings from Last 3 Encounters:  04/10/19 229 lb (103.9 kg)  12/24/18 226 lb 12.8 oz (102.9 kg)  01/19/17 220 lb (99.8 kg)    Physical Exam Vitals signs and nursing note reviewed.  Constitutional:      General: He is not in acute distress.    Appearance: He is  well-developed. He is not diaphoretic.     Comments: Well-appearing, comfortable, cooperative  HENT:     Head: Normocephalic and atraumatic.  Eyes:     General:        Right eye: No discharge.        Left eye: No discharge.     Conjunctiva/sclera: Conjunctivae normal.     Pupils: Pupils are equal, round, and reactive to light.  Neck:     Musculoskeletal: Normal range of motion and neck supple.     Thyroid: No thyromegaly.     Comments: No carotid bruits Cardiovascular:  Rate and Rhythm: Normal rate and regular rhythm.     Heart sounds: Normal heart sounds. No murmur.  Pulmonary:     Effort: Pulmonary effort is normal. No respiratory distress.     Breath sounds: Normal breath sounds. No wheezing or rales.  Abdominal:     General: Bowel sounds are normal. There is no distension.     Palpations: Abdomen is soft. There is no mass.     Tenderness: There is no abdominal tenderness.  Musculoskeletal: Normal range of motion.        General: No tenderness.     Comments: Upper / Lower Extremities: - Normal muscle tone, strength bilateral upper extremities 5/5, lower extremities 5/5  Lymphadenopathy:     Cervical: No cervical adenopathy.  Skin:    General: Skin is warm and dry.     Findings: No erythema or rash.  Neurological:     Mental Status: He is alert and oriented to person, place, and time.     Comments: Distal sensation intact to light touch all extremities  Psychiatric:        Behavior: Behavior normal.     Comments: Well groomed, good eye contact, normal speech and thoughts        Results for orders placed or performed in visit on 03/07/19  HIV Antibody (routine testing w rflx)  Result Value Ref Range   HIV 1&2 Ab, 4th Generation NON-REACTIVE NON-REACTI  PSA  Result Value Ref Range   PSA 0.2 < OR = 4.0 ng/mL  Lipid panel  Result Value Ref Range   Cholesterol 209 (H) <200 mg/dL   HDL 50 > OR = 40 mg/dL   Triglycerides 127 <150 mg/dL   LDL Cholesterol (Calc)  135 (H) mg/dL (calc)   Total CHOL/HDL Ratio 4.2 <5.0 (calc)   Non-HDL Cholesterol (Calc) 159 (H) <130 mg/dL (calc)  COMPLETE METABOLIC PANEL WITH GFR  Result Value Ref Range   Glucose, Bld 82 65 - 99 mg/dL   BUN 14 7 - 25 mg/dL   Creat 1.13 0.60 - 1.35 mg/dL   GFR, Est Non African American 76 > OR = 60 mL/min/1.75m2   GFR, Est African American 88 > OR = 60 mL/min/1.38m2   BUN/Creatinine Ratio NOT APPLICABLE 6 - 22 (calc)   Sodium 140 135 - 146 mmol/L   Potassium 4.2 3.5 - 5.3 mmol/L   Chloride 104 98 - 110 mmol/L   CO2 25 20 - 32 mmol/L   Calcium 9.5 8.6 - 10.3 mg/dL   Total Protein 6.8 6.1 - 8.1 g/dL   Albumin 4.2 3.6 - 5.1 g/dL   Globulin 2.6 1.9 - 3.7 g/dL (calc)   AG Ratio 1.6 1.0 - 2.5 (calc)   Total Bilirubin 0.7 0.2 - 1.2 mg/dL   Alkaline phosphatase (APISO) 56 36 - 130 U/L   AST 13 10 - 40 U/L   ALT 15 9 - 46 U/L  CBC with Differential/Platelet  Result Value Ref Range   WBC 8.0 3.8 - 10.8 Thousand/uL   RBC 4.96 4.20 - 5.80 Million/uL   Hemoglobin 16.2 13.2 - 17.1 g/dL   HCT 47.2 38.5 - 50.0 %   MCV 95.2 80.0 - 100.0 fL   MCH 32.7 27.0 - 33.0 pg   MCHC 34.3 32.0 - 36.0 g/dL   RDW 12.8 11.0 - 15.0 %   Platelets 236 140 - 400 Thousand/uL   MPV 10.7 7.5 - 12.5 fL   Neutro Abs 3,872 1,500 - 7,800 cells/uL  Lymphs Abs 3,000 850 - 3,900 cells/uL   Absolute Monocytes 776 200 - 950 cells/uL   Eosinophils Absolute 312 15 - 500 cells/uL   Basophils Absolute 40 0 - 200 cells/uL   Neutrophils Relative % 48.4 %   Total Lymphocyte 37.5 %   Monocytes Relative 9.7 %   Eosinophils Relative 3.9 %   Basophils Relative 0.5 %  Hemoglobin A1c  Result Value Ref Range   Hgb A1c MFr Bld 5.3 <5.7 % of total Hgb   Mean Plasma Glucose 105 (calc)   eAG (mmol/L) 5.8 (calc)      Assessment & Plan:   Problem List Items Addressed This Visit    Hyperlipidemia    Mostly controlled cholesterol on lifestyle Last lipid panel 03/2019 Calculated ASCVD 10 yr risk score < 7.5%  Plan: 1.  Not on medication, not indicated at this time 2. Encourage improved lifestyle - low carb/cholesterol, reduce portion size, continue improving regular exercise  F/u yearly lipid      Obesity (BMI 30.0-34.9)    Stable weight, with some mild 2-3 lb wt gain in past 3-4 months Encourage diet and exercise lifestyle improvement       Other Visit Diagnoses    Annual physical exam    -  Primary   Screening for colon cancer       Relevant Orders   Cologuard      Updated Health Maintenance information - Ordered Cologuard for colon CA screening, counseling provided - PSA normal, recommend future yearly PSA lab Reviewed recent lab results with patient Encouraged improvement to lifestyle with diet and exercise - Goal of weight loss   No orders of the defined types were placed in this encounter.   Follow up plan: Return in about 1 year (around 04/09/2020) for Annual Physical.  Future labs ordered for 04/07/20  Nobie Putnam, [facility] 04/10/2019, 8:20 AM

## 2019-04-10 NOTE — Assessment & Plan Note (Signed)
Mostly controlled cholesterol on lifestyle Last lipid panel 03/2019 Calculated ASCVD 10 yr risk score < 7.5%  Plan: 1. Not on medication, not indicated at this time 2. Encourage improved lifestyle - low carb/cholesterol, reduce portion size, continue improving regular exercise  F/u yearly lipid

## 2019-04-10 NOTE — Assessment & Plan Note (Signed)
Stable weight, with some mild 2-3 lb wt gain in past 3-4 months Encourage diet and exercise lifestyle improvement

## 2019-04-10 NOTE — Patient Instructions (Addendum)
Thank you for coming to the office today.  1. Chemistry - Normal results, including electrolytes, kidney and liver function. Normal fasting blood sugar   2. Hemoglobin A1c (Diabetes screening) - 5.3, normal not in range of Pre-Diabetes (>5.7 to 6.4)   3. Routine screening HIV - Negative.  4. PSA Prostate Cancer Screening - 0.2, negative.  5. Cholesterol - Normal HDL (good cholesterol), Mild elevated 135 LDL (bad cholesterol), and Normal Triglycerides. Not indicated for Statin therapy at this time.  6. CBC Blood Counts - Normal, no anemia, other abnormality  Colon Cancer Screening: - For all adults age 46+ routine colon cancer screening is highly recommended.     - Recent guidelines from [place] recommend starting age of 17 - Early detection of colon cancer is important, because often there are no warning signs or symptoms, also if found early usually it can be cured. Late stage is hard to treat.  - If you are not interested in Colonoscopy screening (if done and normal you could be cleared for 5 to 10 years until next due), then Cologuard is an excellent alternative for screening test for Colon Cancer. It is highly sensitive for detecting DNA of colon cancer from even the earliest stages. Also, there is NO bowel prep required. - If Cologuard is NEGATIVE, then it is good for 3 years before next due - If Cologuard is POSITIVE, then it is strongly advised to get a Colonoscopy, which allows the GI doctor to locate the source of the cancer or polyp (even very early stage) and treat it by removing it. ------------------------- If you would like to proceed with Cologuard (stool DNA test) - FIRST, call your insurance company and tell them you want to check cost of Cologuard tell them CPT Code [phone] (it may be completely covered and you could get for no cost, OR max cost without any coverage is about $600). Also, keep in mind if you do NOT open the kit, and decide not to do the test,  you will NOT be charged, you should contact the company if you decide not to do the test. - If you want to proceed, you can notify us (phone message, [facility], or at next visit) and we will order it for you. The test kit will be delivered to you house within about 1 week. Follow instructions to collect sample, you may call the company for any help or questions, 24/7 telephone support at [phone].   Please schedule a Follow-up Appointment to: Return in about 1 year (around 04/09/2020) for Annual Physical.  If you have any other questions or concerns, please feel free to call the office or send a message through [facility]. You may also schedule an earlier appointment if necessary.  Additionally, you may be receiving a survey about your experience at our office within a few days to 1 week by e-mail or mail. We value your feedback.  Nobie Putnam, DO [facility], [facility]    Heart-Healthy Eating Plan Many factors influence your heart (coronary) health, including eating and exercise habits. Coronary risk increases with abnormal blood fat (lipid) levels. Heart-healthy meal planning includes limiting unhealthy fats, increasing healthy fats, and making other diet and lifestyle changes. What is my plan? Your health care provider may recommend that you:  Limit your fat intake to _________% or less of your total calories each day.  Limit your saturated fat intake to _________% or less of your total calories each day.  Limit the amount  of cholesterol in your diet to less than _________ mg per day. What are tips for following this plan? Cooking Cook foods using methods other than frying. Baking, boiling, grilling, and broiling are all good options. Other ways to reduce fat include:  Removing the skin from poultry.  Removing all visible fats from meats.  Steaming vegetables in water or broth. Meal planning   At meals, imagine dividing your plate into  fourths: ? Fill one-half of your plate with vegetables and green salads. ? Fill one-fourth of your plate with whole grains. ? Fill one-fourth of your plate with lean protein foods.  Eat 4-5 servings of vegetables per day. One serving equals 1 cup raw or cooked vegetable, or 2 cups raw leafy greens.  Eat 4-5 servings of fruit per day. One serving equals 1 medium whole fruit,  cup dried fruit,  cup fresh, frozen, or canned fruit, or  cup 100% fruit juice.  Eat more foods that contain soluble fiber. Examples include apples, broccoli, carrots, beans, peas, and barley. Aim to get 25-30 g of fiber per day.  Increase your consumption of legumes, nuts, and seeds to 4-5 servings per week. One serving of dried beans or legumes equals  cup cooked, 1 serving of nuts is  cup, and 1 serving of seeds equals 1 tablespoon. Fats  Choose healthy fats more often. Choose monounsaturated and polyunsaturated fats, such as olive and canola oils, flaxseeds, walnuts, almonds, and seeds.  Eat more omega-3 fats. Choose salmon, mackerel, sardines, tuna, flaxseed oil, and ground flaxseeds. Aim to eat fish at least 2 times each week.  Check food labels carefully to identify foods with trans fats or high amounts of saturated fat.  Limit saturated fats. These are found in animal products, such as meats, butter, and cream. Plant sources of saturated fats include palm oil, palm kernel oil, and coconut oil.  Avoid foods with partially hydrogenated oils in them. These contain trans fats. Examples are stick margarine, some tub margarines, cookies, crackers, and other baked goods.  Avoid fried foods. General information  Eat more home-cooked food and less restaurant, buffet, and fast food.  Limit or avoid alcohol.  Limit foods that are high in starch and sugar.  Lose weight if you are overweight. Losing just 5-10% of your body weight can help your overall health and prevent diseases such as diabetes and heart  disease.  Monitor your salt (sodium) intake, especially if you have high blood pressure. Talk with your health care provider about your sodium intake.  Try to incorporate more vegetarian meals weekly. What foods can I eat? Fruits All fresh, canned (in natural juice), or frozen fruits. Vegetables Fresh or frozen vegetables (raw, steamed, roasted, or grilled). Green salads. Grains Most grains. Choose whole wheat and whole grains most of the time. Rice and pasta, including brown rice and pastas made with whole wheat. Meats and other proteins Lean, well-trimmed beef, veal, pork, and lamb. Chicken and [place] without skin. All fish and shellfish. Wild duck, rabbit, pheasant, and venison. Egg whites or low-cholesterol egg substitutes. Dried beans, peas, lentils, and tofu. Seeds and most nuts. Dairy Low-fat or nonfat cheeses, including ricotta and mozzarella. Skim or 1% milk (liquid, powdered, or evaporated). Buttermilk made with low-fat milk. Nonfat or low-fat yogurt. Fats and oils Non-hydrogenated (trans-free) margarines. Vegetable oils, including soybean, sesame, sunflower, olive, peanut, safflower, corn, canola, and cottonseed. Salad dressings or mayonnaise made with a vegetable oil. Beverages Water (mineral or sparkling). Coffee and tea. Diet carbonated beverages. Sweets  and desserts Sherbet, gelatin, and fruit ice. Small amounts of dark chocolate. Limit all sweets and desserts. Seasonings and condiments All seasonings and condiments. The items listed above may not be a complete list of foods and beverages you can eat. Contact a dietitian for more options. What foods are not recommended? Fruits Canned fruit in heavy syrup. Fruit in cream or butter sauce. Fried fruit. Limit coconut. Vegetables Vegetables cooked in cheese, cream, or butter sauce. Fried vegetables. Grains Breads made with saturated or trans fats, oils, or whole milk. Croissants. Sweet rolls. Donuts. High-fat crackers, such  as cheese crackers. Meats and other proteins Fatty meats, such as hot dogs, ribs, sausage, bacon, rib-eye roast or steak. High-fat deli meats, such as salami and bologna. Caviar. Domestic duck and goose. Organ meats, such as liver. Dairy Cream, sour cream, cream cheese, and creamed cottage cheese. Whole milk cheeses. Whole or 2% milk (liquid, evaporated, or condensed). Whole buttermilk. Cream sauce or high-fat cheese sauce. Whole-milk yogurt. Fats and oils Meat fat, or shortening. Cocoa butter, hydrogenated oils, palm oil, coconut oil, palm kernel oil. Solid fats and shortenings, including bacon fat, salt pork, lard, and butter. Nondairy cream substitutes. Salad dressings with cheese or sour cream. Beverages Regular sodas and any drinks with added sugar. Sweets and desserts Frosting. Pudding. Cookies. Cakes. Pies. Milk chocolate or white chocolate. Buttered syrups. Full-fat ice cream or ice cream drinks. The items listed above may not be a complete list of foods and beverages to avoid. Contact a dietitian for more information. Summary  Heart-healthy meal planning includes limiting unhealthy fats, increasing healthy fats, and making other diet and lifestyle changes.  Lose weight if you are overweight. Losing just 5-10% of your body weight can help your overall health and prevent diseases such as diabetes and heart disease.  Focus on eating a balance of foods, including fruits and vegetables, low-fat or nonfat dairy, lean protein, nuts and legumes, whole grains, and heart-healthy oils and fats. This information is not intended to replace advice given to you by your health care provider. Make sure you discuss any questions you have with your health care provider. Document Released: 08/02/2008 Document Revised: 12/01/2017 Document Reviewed: 12/01/2017 Elsevier Interactive Patient Education  2019 [employer].

## 2019-08-19 ENCOUNTER — Telehealth: Payer: Self-pay | Admitting: Family Medicine

## 2019-08-19 NOTE — Telephone Encounter (Signed)
Called by on-call nurse. Having body aches and fevers with some SOB with walking up stairs. Going to call Dr. Raliegh Ip tomorrow for appointment. Advised rest tonight to go to ER if getting worse. FYI.

## 2019-08-20 ENCOUNTER — Other Ambulatory Visit: Payer: Self-pay

## 2019-08-20 ENCOUNTER — Ambulatory Visit (INDEPENDENT_AMBULATORY_CARE_PROVIDER_SITE_OTHER): Payer: BLUE CROSS/BLUE SHIELD | Admitting: Nurse Practitioner

## 2019-08-20 ENCOUNTER — Encounter: Payer: Self-pay | Admitting: Nurse Practitioner

## 2019-08-20 DIAGNOSIS — Z20822 Contact with and (suspected) exposure to covid-19: Secondary | ICD-10-CM

## 2019-08-20 DIAGNOSIS — Z20828 Contact with and (suspected) exposure to other viral communicable diseases: Secondary | ICD-10-CM

## 2019-08-20 NOTE — Telephone Encounter (Signed)
Attempted to contact the pt, no answer. LMOm to return my call.  

## 2019-08-20 NOTE — Progress Notes (Signed)
Telemedicine Encounter: Disclosed to patient at start of encounter that we will provide appropriate telemedicine services.  Patient consents to be treated via phone prior to discussion. - Patient is at his home and is accessed via telephone. - Services are provided by Cassell Smiles from [facility].  Subjective:    Patient ID: Garrett Mcmahon, male    DOB: September 10, 1971, 48 y.o.   MRN: [id number]  Garrett Mcmahon is a 48 y.o. male presenting on 08/20/2019 for Generalized Body Aches (x3 days) and Fatigue  HPI Covid-like symptoms Patient had onset of body aches and fatigue 3 days ago. Patient has also had cough, sneezing, nasal congestion > when first waking up (improves throughout day except when sneezing). - Patient has not had Covid testing and would like Covid testing. - Patient states this feels like cold/sinus infection and may not have called for treatment except Covid pandemic. - No fever, chills, or sweats, nausea, vomiting, diarrhea. - No known contacts with positive covid testing  Social History   Tobacco Use  . Smoking status: Never Smoker  . Smokeless tobacco: Current User    Types: Chew  . Tobacco comment: 1 can dip per day for 26+ years  Substance Use Topics  . Alcohol use: No  . Drug use: No    Review of Systems Per HPI unless specifically indicated above     Objective:    There were no vitals taken for this visit.  Wt Readings from Last 3 Encounters:  04/10/19 229 lb (103.9 kg)  12/24/18 226 lb 12.8 oz (102.9 kg)  01/19/17 220 lb (99.8 kg)    Physical Exam Patient remotely monitored.  Verbal communication appropriate.  Cognition normal.   Results for orders placed or performed in visit on 03/07/19  HIV Antibody (routine testing w rflx)  Result Value Ref Range   HIV 1&2 Ab, 4th Generation NON-REACTIVE NON-REACTI  PSA  Result Value Ref Range   PSA 0.2 < OR = 4.0 ng/mL  Lipid panel  Result Value Ref Range   Cholesterol 209 (H) <200  mg/dL   HDL 50 > OR = 40 mg/dL   Triglycerides 127 <150 mg/dL   LDL Cholesterol (Calc) 135 (H) mg/dL (calc)   Total CHOL/HDL Ratio 4.2 <5.0 (calc)   Non-HDL Cholesterol (Calc) 159 (H) <130 mg/dL (calc)  COMPLETE METABOLIC PANEL WITH GFR  Result Value Ref Range   Glucose, Bld 82 65 - 99 mg/dL   BUN 14 7 - 25 mg/dL   Creat 1.13 0.60 - 1.35 mg/dL   GFR, Est Non African American 76 > OR = 60 mL/min/1.38m2   GFR, Est African American 88 > OR = 60 mL/min/1.34m2   BUN/Creatinine Ratio NOT APPLICABLE 6 - 22 (calc)   Sodium 140 135 - 146 mmol/L   Potassium 4.2 3.5 - 5.3 mmol/L   Chloride 104 98 - 110 mmol/L   CO2 25 20 - 32 mmol/L   Calcium 9.5 8.6 - 10.3 mg/dL   Total Protein 6.8 6.1 - 8.1 g/dL   Albumin 4.2 3.6 - 5.1 g/dL   Globulin 2.6 1.9 - 3.7 g/dL (calc)   AG Ratio 1.6 1.0 - 2.5 (calc)   Total Bilirubin 0.7 0.2 - 1.2 mg/dL   Alkaline phosphatase (APISO) 56 36 - 130 U/L   AST 13 10 - 40 U/L   ALT 15 9 - 46 U/L  CBC with Differential/Platelet  Result Value Ref Range   WBC 8.0 3.8 - 10.8 Thousand/uL  RBC 4.96 4.20 - 5.80 Million/uL   Hemoglobin 16.2 13.2 - 17.1 g/dL   HCT 47.2 38.5 - 50.0 %   MCV 95.2 80.0 - 100.0 fL   MCH 32.7 27.0 - 33.0 pg   MCHC 34.3 32.0 - 36.0 g/dL   RDW 12.8 11.0 - 15.0 %   Platelets 236 140 - 400 Thousand/uL   MPV 10.7 7.5 - 12.5 fL   Neutro Abs 3,872 1,500 - 7,800 cells/uL   Lymphs Abs 3,000 850 - 3,900 cells/uL   Absolute Monocytes 776 200 - 950 cells/uL   Eosinophils Absolute 312 15 - 500 cells/uL   Basophils Absolute 40 0 - 200 cells/uL   Neutrophils Relative % 48.4 %   Total Lymphocyte 37.5 %   Monocytes Relative 9.7 %   Eosinophils Relative 3.9 %   Basophils Relative 0.5 %  Hemoglobin A1c  Result Value Ref Range   Hgb A1c MFr Bld 5.3 <5.7 % of total Hgb   Mean Plasma Glucose 105 (calc)   eAG (mmol/L) 5.8 (calc)      Assessment & Plan:   Problem List Items Addressed This Visit    None    Visit Diagnoses    Encounter by telehealth  for suspected COVID-19    -  Primary     Patient with covid like symptoms x 3 days.  Not significantly improving.  Classified by bodyaches and fatigue with some other URI symptoms as above.  Possible diagnoses include Covid, viral URI, or sinsuitis.  Plan: 1. Covid testing today.  Instructions given for [facility] testing site. 2. Treat symptoms with OTC Mucinex, Mucinex DM, acetaminophen. 3. Reviewed quarantine instructions including recommendations to keep members of family well. 4. Follow-up 5-7 days after Covid test if symptoms persist for possible sinusitis treatment with antibiotics.  Patient verbalizes understanding.  - Time spent in direct consultation with patient via telemedicine about above concerns: 8 minutes  Follow up plan: Follow-up 5-7 days prn.  Cassell Smiles, DNP, AGPCNP-BC Adult Gerontology Primary Care Nurse Practitioner [facility] 08/20/2019, 11:28 AM

## 2019-08-22 LAB — NOVEL CORONAVIRUS, NAA: SARS-CoV-2, NAA: NOT DETECTED

## 2019-08-23 ENCOUNTER — Other Ambulatory Visit: Payer: Self-pay

## 2019-08-23 ENCOUNTER — Encounter: Payer: Self-pay | Admitting: Nurse Practitioner

## 2019-08-23 ENCOUNTER — Ambulatory Visit (INDEPENDENT_AMBULATORY_CARE_PROVIDER_SITE_OTHER): Payer: BLUE CROSS/BLUE SHIELD | Admitting: Nurse Practitioner

## 2019-08-23 DIAGNOSIS — R5383 Other fatigue: Secondary | ICD-10-CM

## 2019-08-23 DIAGNOSIS — Z03818 Encounter for observation for suspected exposure to other biological agents ruled out: Secondary | ICD-10-CM

## 2019-08-23 DIAGNOSIS — Z20822 Contact with and (suspected) exposure to covid-19: Secondary | ICD-10-CM

## 2019-08-23 NOTE — Progress Notes (Signed)
   Telemedicine Encounter: Disclosed to patient at start of encounter that we will provide appropriate telemedicine services.  Patient consents to be treated via phone prior to discussion. - Patient is at his home and is accessed via telephone. - Services are provided by Cassell Smiles from [facility].  Subjective:    Patient ID: Jonnie Finner, male    DOB: September 02, 1971, 48 y.o.   MRN: [id number]  KAIQUE CARTWRIGHT is a 48 y.o. male presenting on 08/23/2019 for Cough (sneezing, fatigue, and SOB w/ exertion. x 7 days )  HPI Fatigue Continues to have no energy/fatigue.  Coughing and sneezing is improving some.  Patient has difficulty after coming back up the stairs.   - Patient has very little appetite,  - Consistency hard BM, but still at normal frequency - Denies fever, chills, sweats, nausea, vomiting, or diarrhea.  Has no change in taste/smell, no rhinorrhea today.  Social History   Tobacco Use  . Smoking status: Never Smoker  . Smokeless tobacco: Current User    Types: Chew  . Tobacco comment: 1 can dip per day for 26+ years  Substance Use Topics  . Alcohol use: No  . Drug use: No   Review of Systems Per HPI unless specifically indicated above     Objective:    There were no vitals taken for this visit.  Wt Readings from Last 3 Encounters:  04/10/19 229 lb (103.9 kg)  12/24/18 226 lb 12.8 oz (102.9 kg)  01/19/17 220 lb (99.8 kg)    Physical Exam Patient remotely monitored.  Verbal communication appropriate.  Cognition normal.  Results for orders placed or performed in visit on 08/20/19  Novel Coronavirus, NAA ([facility])   Specimen: Nasopharyngeal(NP) swabs in vial transport medium   NASOPHARYNGE  TESTING  Result Value Ref Range   SARS-CoV-2, NAA Not Detected Not Detected      Assessment & Plan:   Problem List Items Addressed This Visit    None    Visit Diagnoses    Other fatigue    -  Primary   Lab test negative for COVID-19 virus         Patient continues to have severe fatigue.  Tested negative for Covid on 08/20/2019.  Patient seeks assistance with finding out cause /treatment for fatigue.  Patient likely dehydrated due to poor appetite/constipation reports.  Continue to suspect possible Covid infection despite neg testing result.  Plan: 1. Continue adequate oral intake of food and liquids.  - Add in electrolyte drink (pedialyte, gatorade/powerade) - Continue adequate amount of water.   2. Labs can be done for testing cause of disease - patient with past history of Lyme or RMSF (never were able to ID exactly which) and patient is concerned about recurrence 3. Continue treating cough with otc meds 4. Follow-up 5-7 days prn no improvement.    - Time spent in direct consultation with patient via telemedicine about above concerns: 12 minutes  Follow up plan: PRN 5-7 days  Cassell Smiles, DNP, AGPCNP-BC Adult Gerontology Primary Care Nurse Practitioner [facility] 08/23/2019, 2:14 PM

## 2019-08-25 ENCOUNTER — Encounter: Payer: Self-pay | Admitting: Nurse Practitioner

## 2020-04-07 ENCOUNTER — Other Ambulatory Visit: Payer: BLUE CROSS/BLUE SHIELD

## 2020-04-07 DIAGNOSIS — Z0000 Encounter for general adult medical examination without abnormal findings: Secondary | ICD-10-CM

## 2020-04-07 DIAGNOSIS — E7800 Pure hypercholesterolemia, unspecified: Secondary | ICD-10-CM

## 2020-04-08 LAB — COMPLETE METABOLIC PANEL WITH GFR
AG Ratio: 1.5 (calc) (ref 1.0–2.5)
ALT: 34 U/L (ref 9–46)
AST: 23 U/L (ref 10–35)
Albumin: 4 g/dL (ref 3.6–5.1)
Alkaline phosphatase (APISO): 63 U/L (ref 35–144)
BUN: 9 mg/dL (ref 7–25)
CO2: 29 mmol/L (ref 20–32)
Calcium: 9.7 mg/dL (ref 8.6–10.3)
Chloride: 104 mmol/L (ref 98–110)
Creat: 1.01 mg/dL (ref 0.70–1.33)
GFR, Est African American: 100 mL/min/{1.73_m2} (ref >=60)
GFR, Est Non African American: 86 mL/min/{1.73_m2} (ref >=60)
Globulin: 2.7 g/dL (calc) (ref 1.9–3.7)
Glucose, Bld: 94 mg/dL (ref 65–99)
Potassium: 4.7 mmol/L (ref 3.5–5.3)
Sodium: 141 mmol/L (ref 135–146)
Total Bilirubin: 0.9 mg/dL (ref 0.2–1.2)
Total Protein: 6.7 g/dL (ref 6.1–8.1)

## 2020-04-08 LAB — LIPID PANEL
Cholesterol: 216 mg/dL — ABNORMAL HIGH (ref <200)
HDL: 39 mg/dL — ABNORMAL LOW (ref >=40)
LDL Cholesterol (Calc): 147 mg/dL (calc) — ABNORMAL HIGH
Non-HDL Cholesterol (Calc): 177 mg/dL (calc) — ABNORMAL HIGH (ref <130)
Total CHOL/HDL Ratio: 5.5 (calc) — ABNORMAL HIGH (ref <5.0)
Triglycerides: 161 mg/dL — ABNORMAL HIGH (ref <150)

## 2020-04-08 LAB — CBC WITH DIFFERENTIAL/PLATELET
Absolute Monocytes: 859 cells/uL (ref 200–950)
Basophils Absolute: 53 cells/uL (ref 0–200)
Basophils Relative: 0.7 %
Eosinophils Absolute: 388 cells/uL (ref 15–500)
Eosinophils Relative: 5.1 %
HCT: 49.2 % (ref 38.5–50.0)
Hemoglobin: 16.5 g/dL (ref 13.2–17.1)
Lymphs Abs: 2470 cells/uL (ref 850–3900)
MCH: 32.4 pg (ref 27.0–33.0)
MCHC: 33.5 g/dL (ref 32.0–36.0)
MCV: 96.7 fL (ref 80.0–100.0)
MPV: 10.8 fL (ref 7.5–12.5)
Monocytes Relative: 11.3 %
Neutro Abs: 3830 cells/uL (ref 1500–7800)
Neutrophils Relative %: 50.4 %
Platelets: 274 10*3/uL (ref 140–400)
RBC: 5.09 10*6/uL (ref 4.20–5.80)
RDW: 12.2 % (ref 11.0–15.0)
Total Lymphocyte: 32.5 %
WBC: 7.6 10*3/uL (ref 3.8–10.8)

## 2020-04-08 LAB — HEMOGLOBIN A1C
Hgb A1c MFr Bld: 5.3 % of total Hgb (ref <5.7)
Mean Plasma Glucose: 105 (calc)
eAG (mmol/L): 5.8 (calc)

## 2020-04-08 LAB — PSA: PSA: 0.2 ng/mL (ref <=4.0)

## 2020-04-13 ENCOUNTER — Encounter: Payer: BLUE CROSS/BLUE SHIELD | Admitting: Family Medicine

## 2020-04-15 ENCOUNTER — Encounter: Payer: BLUE CROSS/BLUE SHIELD | Admitting: Family Medicine

## 2020-04-15 ENCOUNTER — Other Ambulatory Visit: Payer: Self-pay

## 2020-11-11 ENCOUNTER — Other Ambulatory Visit: Payer: 59

## 2020-11-11 DIAGNOSIS — Z20822 Contact with and (suspected) exposure to covid-19: Secondary | ICD-10-CM

## 2020-11-12 ENCOUNTER — Telehealth (INDEPENDENT_AMBULATORY_CARE_PROVIDER_SITE_OTHER): Payer: Self-pay | Admitting: Family Medicine

## 2020-11-12 ENCOUNTER — Other Ambulatory Visit: Payer: Self-pay

## 2020-11-12 ENCOUNTER — Encounter: Payer: Self-pay | Admitting: Family Medicine

## 2020-11-12 DIAGNOSIS — Z20822 Contact with and (suspected) exposure to covid-19: Secondary | ICD-10-CM

## 2020-11-12 LAB — NOVEL CORONAVIRUS, NAA: SARS-CoV-2, NAA: NOT DETECTED

## 2020-11-12 LAB — SARS-COV-2, NAA 2 DAY TAT

## 2020-11-12 MED ORDER — PREDNISONE 10 MG PO TABS: ORAL_TABLET | ORAL | 0 refills | Status: DC

## 2020-11-12 NOTE — Progress Notes (Signed)
Virtual Visit via Telephone The purpose of this virtual visit is to provide medical care while limiting exposure to the novel coronavirus (COVID19) for both patient and office staff.  Consent was obtained for phone visit:  Yes.   Answered questions that patient had about telehealth interaction:  Yes.   I discussed the limitations, risks, security and privacy concerns of performing an evaluation and management service by telephone. I also discussed with the patient that there may be a patient responsible charge related to this service. The patient expressed understanding and agreed to proceed.  Patient Location: Home Provider Location: [facility] (Office)  Participants in virtual visit: - Patient: Lennell Shanks. Piasecki - CMA: Elvina Mattes, CMA - Provider: Dr Althea Charon  ---------------------------------------------------------------------- Chief Complaint  Patient presents with  . Nasal Congestion    Sore throat, diarrhea, HA, body ache, chest congestion got tested yesterday but waiting for test result onset 2 days    S: Reviewed CMA documentation. I have called patient and gathered additional HPI as follows:  Suspected COVID Reports that symptoms started 2 days ago with sudden acute onset sore throat, sinus congestion headache body ache chest congestion. - Tried OTC Tylenol / Ibuprofen PRN with fever control - Taking Flonase. Taking OTC Tussin - Admits some mild short of breath worse at night. Has woken up overnight.  Patient is currently isolation Denies any high risk travel to areas of current concern for COVID19. Denies any known or suspected exposure to person with or possibly with COVID19.  Denies any fevers, chills, sweats, body ache, cough, shortness of breath, sinus pain or pressure, headache, abdominal pain, diarrhea  Past Medical History:  Diagnosis Date  . Family history of MI (myocardial infarction) 01/18/2017   Age- 34's  . H/O shoulder surgery   .  Inguinal hernia 1971   Repaired at 2 months old   . Tobacco use    Social History   Tobacco Use  . Smoking status: Never Smoker  . Smokeless tobacco: Current User    Types: Chew  . Tobacco comment: 1 can dip per day for 26+ years  Vaping Use  . Vaping Use: Never used  Substance Use Topics  . Alcohol use: No  . Drug use: No    Current Outpatient Medications:  .  fluticasone (FLONASE) 50 MCG/ACT nasal spray, Place 2 sprays into both nostrils daily. Use for 4-6 weeks then stop and use seasonally or as needed., Disp: 16 g, Rfl: 3 .  phenylephrine (SUDAFED PE) 10 MG TABS tablet, Take 10 mg by mouth every 4 (four) hours as needed., Disp: , Rfl:  .  predniSONE (DELTASONE) 10 MG tablet, Take 6 tabs with breakfast Day 1, 5 tabs Day 2, 4 tabs Day 3, 3 tabs Day 4, 2 tabs Day 5, 1 tab Day 6., Disp: 21 tablet, Rfl: 0  Depression screen [facility] 2/9 08/20/2019 12/24/2018  Decreased Interest 1 0  Down, Depressed, Hopeless 0 0  PHQ - 2 Score 1 0  Altered sleeping 3 -  Tired, decreased energy 1 -  Change in appetite 1 -  Feeling bad or failure about yourself  0 -  Trouble concentrating 0 -  Moving slowly or fidgety/restless 1 -  Suicidal thoughts 0 -  PHQ-9 Score 7 -    No flowsheet data found.  -------------------------------------------------------------------------- O: No physical exam performed due to remote telephone encounter.  Lab results reviewed.  No results found for this or any previous visit (from the past 2160 hour(s)).  --------------------------------------------------------------------------  A&P:  Problem List Items Addressed This Visit   None   Visit Diagnoses    Suspected COVID-19 virus infection    -  Primary   Relevant Medications   predniSONE (DELTASONE) 10 MG tablet     Clinically suspected COVID19 infection Acute viral syndrome Testing pending, done yesterday at [facility] site, await result 1-2 days now [employer]  as covid protocol He does not need  work note Continue OTC supportive care, nsaid tylenol, cough medicines, mucinex, nasal spray Add prednisone for some dyspnea at night taper, hold oral nsaid Follow-up criteria given, when to seek care, consider future CXR if indicated  Meds ordered this encounter  Medications  . predniSONE (DELTASONE) 10 MG tablet    Sig: Take 6 tabs with breakfast Day 1, 5 tabs Day 2, 4 tabs Day 3, 3 tabs Day 4, 2 tabs Day 5, 1 tab Day 6.    Dispense:  21 tablet    Refill:  0    Follow-up: - Return in 1 week if not improved  Patient verbalizes understanding with the above medical recommendations including the limitation of remote medical advice.  Specific follow-up and call-back criteria were given for patient to follow-up or seek medical care more urgently if needed.   - Time spent in direct consultation with patient on phone: 7 minutes   Nobie Putnam, [facility] 11/12/2020, 9:43 AM

## 2020-11-19 ENCOUNTER — Other Ambulatory Visit: Payer: 59

## 2020-11-19 DIAGNOSIS — Z20822 Contact with and (suspected) exposure to covid-19: Secondary | ICD-10-CM

## 2020-11-21 LAB — NOVEL CORONAVIRUS, NAA: SARS-CoV-2, NAA: DETECTED — AB

## 2020-11-21 LAB — SARS-COV-2, NAA 2 DAY TAT

## 2020-11-25 ENCOUNTER — Encounter: Payer: Self-pay | Admitting: Emergency Medicine

## 2020-11-25 ENCOUNTER — Emergency Department
Admission: EM | Admit: 2020-11-25 | Discharge: 2020-11-25 | Disposition: A | Discharge status: Home / Self Care | Payer: 59 | Attending: Emergency Medicine | Admitting: Emergency Medicine

## 2020-11-25 ENCOUNTER — Emergency Department: Payer: 59

## 2020-11-25 ENCOUNTER — Other Ambulatory Visit: Payer: Self-pay

## 2020-11-25 ENCOUNTER — Ambulatory Visit: Payer: Self-pay

## 2020-11-25 DIAGNOSIS — J1282 Pneumonia due to coronavirus disease 2019: Secondary | ICD-10-CM

## 2020-11-25 DIAGNOSIS — R0602 Shortness of breath: Secondary | ICD-10-CM | POA: Diagnosis present

## 2020-11-25 DIAGNOSIS — F17200 Nicotine dependence, unspecified, uncomplicated: Secondary | ICD-10-CM | POA: Diagnosis not present

## 2020-11-25 DIAGNOSIS — R079 Chest pain, unspecified: Secondary | ICD-10-CM

## 2020-11-25 DIAGNOSIS — U071 COVID-19: Secondary | ICD-10-CM | POA: Diagnosis not present

## 2020-11-25 LAB — BASIC METABOLIC PANEL
Anion gap: 10 (ref 5–15)
BUN: 9 mg/dL (ref 6–20)
CO2: 27 mmol/L (ref 22–32)
Calcium: 8.7 mg/dL — ABNORMAL LOW (ref 8.9–10.3)
Chloride: 103 mmol/L (ref 98–111)
Creatinine, Ser: 0.85 mg/dL (ref 0.61–1.24)
GFR, Estimated: >60 mL/min (ref >60)
Glucose, Bld: 94 mg/dL (ref 70–99)
Potassium: 3.7 mmol/L (ref 3.5–5.1)
Sodium: 140 mmol/L (ref 135–145)

## 2020-11-25 LAB — CBC
HCT: 46.6 % (ref 39.0–52.0)
Hemoglobin: 15.8 g/dL (ref 13.0–17.0)
MCH: 32.2 pg (ref 26.0–34.0)
MCHC: 33.9 g/dL (ref 30.0–36.0)
MCV: 94.9 fL (ref 80.0–100.0)
Platelets: 197 10*3/uL (ref 150–400)
RBC: 4.91 MIL/uL (ref 4.22–5.81)
RDW: 12.6 % (ref 11.5–15.5)
WBC: 7.3 10*3/uL (ref 4.0–10.5)
nRBC: 0 % (ref 0.0–0.2)

## 2020-11-25 LAB — TROPONIN I (HIGH SENSITIVITY): Troponin I (High Sensitivity): 7 ng/L (ref <18)

## 2020-11-25 MED ORDER — ALBUTEROL SULFATE HFA 108 (90 BASE) MCG/ACT IN AERS
2.0000 | INHALATION_SPRAY | Freq: Four times a day (QID) | RESPIRATORY_TRACT | 0 refills | Status: DC | PRN
Start: 2020-11-25 — End: 2021-04-06

## 2020-11-25 NOTE — ED Notes (Signed)
Blue top sent to lab with save label.

## 2020-11-25 NOTE — ED Triage Notes (Signed)
Pt comes into the ED via POV c/o weakness and SHOB that has continued since he was diagnosed with COVID 11/20/19.  Pt states both have worsened since then.  Pt currently has even and unlabored respirations.  He states he is sleeping 12+ hrs a day.  Pt still eating and drinking but it has decreased since diagnoses.  Pt states his biggest problem is when he lays down it feels like a bag of bricks are laying on his chest. Pt ambulatory to triage at this time.

## 2020-11-25 NOTE — ED Notes (Signed)
Pt assessed by provider prior to d/c

## 2020-11-25 NOTE — Telephone Encounter (Signed)
Patient called stating that he tested positive for COVID-19 on 11/19/20 with symptoms onset 2 weeks ago.  He is calling today because he has developed SOB and chest pressure. He states that the pressure is mostly when he is lying down  He had cough He states he had been taking extra strength tylenol constant and does not have fever or headache because of that He has diarrhea. Water can bring it on. Per protocol patient will go to ER for evaluation. Care advice read to patient. He verbalized understanding and will follow plan of care.  Reason for Disposition . Chest pain or pressure  Answer Assessment - Initial Assessment Questions 1. COVID-19 DIAGNOSIS: "Who made your COVID-19 diagnosis?" "Was it confirmed by a positive lab test?" If not diagnosed by a HCP, ask "Are there lots of cases (community spread) where you live?" Note: See public health department website, if unsure.     yes 2. COVID-19 EXPOSURE: "Was there any known exposure to COVID before the symptoms began?" CDC Definition of close contact: within 6 feet (2 meters) for a total of 15 minutes or more over a 24-hour period.      COVID-19 positive Jan13 3. ONSET: "When did the COVID-19 symptoms start?"      2 weeks ago 4. WORST SYMPTOM: "What is your worst symptom?" (e.g., cough, fever, shortness of breath, muscle aches)     SOB with chest tightness 5. COUGH: "Do you have a cough?" If Yes, ask: "How bad is the cough?"       yes 6. FEVER: "Do you have a fever?" If Yes, ask: "What is your temperature, how was it measured, and when did it start?"    no 7. RESPIRATORY STATUS: "Describe your breathing?" (e.g., shortness of breath, wheezing, unable to speak)      SOB with chest feeling tight 8. BETTER-SAME-WORSE: "Are you getting better, staying the same or getting worse compared to yesterday?"  If getting worse, ask, "In what way?"    worse 9. HIGH RISK DISEASE: "Do you have any chronic medical problems?" (e.g., asthma, heart or lung disease,  weak immune system, obesity, etc.)   No Hx of heart dx in family 26. VACCINE: "Have you gotten the COVID-19 vaccine?" If Yes ask: "Which one, how many shots, when did you get it?"    no   11. PREGNANCY: "Is there any chance you are pregnant?" "When was your last menstrual period?"      N/A 12. OTHER SYMPTOMS: "Do you have any other symptoms?"  (e.g., chills, fatigue, headache, loss of smell or taste, muscle pain, sore throat; new loss of smell or taste especially support the diagnosis of COVID-19)       Fever headache diarrhea  Protocols used: CORONAVIRUS (COVID-19) DIAGNOSED OR SUSPECTED-A-AH

## 2020-11-25 NOTE — ED Provider Notes (Signed)
[facility] Emergency Department Provider Note   ____________________________________________   Event Date/Time   First MD Initiated Contact with Patient 11/25/20 1532     (approximate)  I have reviewed the triage vital signs and the nursing notes.   HISTORY  Chief Complaint Shortness of Breath and Weakness    HPI Garrett Mcmahon is a 50 y.o. male with past medical history of hyperlipidemia who presents to the ED complaining of shortness of breath.  Patient reports that he for started feeling bad about 2 weeks ago with subjective fevers, chills, cough, fatigue, and body aches.  He subsequently tested positive for COVID-19 6 days ago.  Since then, he has had increasing difficulty breathing and pressure in his chest whenever he goes to take a deep breath.  He also reports ongoing diarrhea, but has not had any abdominal pain, nausea, or vomiting.  He spoke with his PCPs office earlier today, who recommended he come to the ED for further evaluation.        Past Medical History:  Diagnosis Date  . Family history of MI (myocardial infarction) 01/18/2017   Age- 45's  . H/O shoulder surgery   . Inguinal hernia 1971   Repaired at 2 months old   . Tobacco use     Patient Active Problem List   Diagnosis Date Noted  . Hyperlipidemia 04/10/2019  . S/P rotator cuff surgery 12/24/2018  . Obesity (BMI 30.0-34.9) 12/24/2018  . Bilateral inguinal hernia, without obstruction or gangrene, recurrent 12/24/2018  . Right inguinal hernia 01/18/2017  . Tobacco use disorder 01/18/2017  . Family history of MI (myocardial infarction) 01/18/2017    Past Surgical History:  Procedure Laterality Date  . INGUINAL HERNIA REPAIR Right 1971   Repaired at 34 months old  . KNEE ARTHROSCOPY Right    x2  . KNEE ARTHROSCOPY W/ ACL RECONSTRUCTION Right     Prior to Admission medications   Medication Sig Start Date End Date Taking? Authorizing Provider  albuterol (VENTOLIN HFA)  108 (90 Base) MCG/ACT inhaler Inhale 2 puffs into the lungs every 6 (six) hours as needed for wheezing or shortness of breath. 11/25/20  Yes Blake Divine, MD  fluticasone [facility]) 50 MCG/ACT nasal spray Place 2 sprays into both nostrils daily. Use for 4-6 weeks then stop and use seasonally or as needed. 12/24/18   Karamalegos, Devonne Doughty, DO  phenylephrine (SUDAFED PE) 10 MG TABS tablet Take 10 mg by mouth every 4 (four) hours as needed.    [provider]  predniSONE (DELTASONE) 10 MG tablet Take 6 tabs with breakfast Day 1, 5 tabs Day 2, 4 tabs Day 3, 3 tabs Day 4, 2 tabs Day 5, 1 tab Day 6. 11/12/20   Parks Ranger, Devonne Doughty, DO    Allergies Patient has no known allergies.  Family History  Problem Relation Age of Onset  . COPD Mother   . Bipolar disorder Father   . Alcohol abuse Father   . Heart attack Maternal Grandmother   . Breast cancer Maternal Grandmother   . Heart disease Maternal Grandmother   . Heart disease Paternal Grandfather   . Heart attack Paternal Grandfather   . Lung cancer Paternal Grandfather        Smoker  . Cancer Paternal Grandfather        Lung  . Heart disease Maternal Uncle   . Cancer Paternal Grandmother        breast  . Prostate cancer Neg Hx   .  Colon cancer Neg Hx     Social History Social History   Tobacco Use  . Smoking status: Never Smoker  . Smokeless tobacco: Current User    Types: Chew  . Tobacco comment: 1 can dip per day for 26+ years  Vaping Use  . Vaping Use: Never used  Substance Use Topics  . Alcohol use: No  . Drug use: No    Review of Systems  Constitutional: No fever/chills.  Positive for generalized weakness and fatigue. Eyes: No visual changes. ENT: No sore throat. Cardiovascular: Positive for chest pain. Respiratory: Positive for cough and shortness of breath. Gastrointestinal: No abdominal pain.  No nausea, no vomiting.  No diarrhea.  No constipation. Genitourinary: Negative for  dysuria. Musculoskeletal: Negative for back pain. Skin: Negative for rash. Neurological: Negative for headaches, focal weakness or numbness.  ____________________________________________   PHYSICAL EXAM:  VITAL SIGNS: ED Triage Vitals  Enc Vitals Group     BP 11/25/20 1306 132/90     Pulse Rate 11/25/20 1306 71     Resp 11/25/20 1306 18     Temp 11/25/20 1306 97.8 F (36.6 C)     Temp Source 11/25/20 1306 Oral     SpO2 11/25/20 1306 96 %     Weight 11/25/20 1308 220 lb (99.8 kg)     Height 11/25/20 1308 5\' 11"  (1.803 m)     Head Circumference --      Peak Flow --      Pain Score 11/25/20 1307 7     Pain Loc --      Pain Edu? --      Excl. in [place]? --     Constitutional: Alert and oriented. Eyes: Conjunctivae are normal. Head: Atraumatic. Nose: No congestion/rhinnorhea. Mouth/Throat: Mucous membranes are moist. Neck: Normal ROM Cardiovascular: Normal rate, regular rhythm. Grossly normal heart sounds.  2+ radial pulses bilaterally. Respiratory: Normal respiratory effort.  No retractions. Lungs CTAB. Gastrointestinal: Soft and nontender. No distention. Genitourinary: deferred Musculoskeletal: No lower extremity tenderness nor edema. Neurologic:  Normal speech and language. No gross focal neurologic deficits are appreciated. Skin:  Skin is warm, dry and intact. No rash noted. Psychiatric: Mood and affect are normal. Speech and behavior are normal.  ____________________________________________   LABS (all labs ordered are listed, but only abnormal results are displayed)  Labs Reviewed  BASIC METABOLIC PANEL - Abnormal; Notable for the following components:      Result Value   Calcium 8.7 (*)    All other components within normal limits  CBC  URINALYSIS, COMPLETE (UACMP) WITH MICROSCOPIC  TROPONIN I (HIGH SENSITIVITY)   ____________________________________________  EKG  ED ECG REPORT I, Blake Divine, the attending physician, personally viewed and interpreted  this ECG.   Date: 11/25/2020  EKG Time: 13:02  Rate: 76  Rhythm: normal sinus rhythm  Axis: RAD  Intervals:none  ST&T Change: None   PROCEDURES  Procedure(s) performed (including Critical Care):  Procedures   ____________________________________________   INITIAL IMPRESSION / ASSESSMENT AND PLAN / ED COURSE       50 year old male with past medical history of hyperlipidemia who presents to the ED complaining of increasing fatigue, generalized weakness, cough, shortness of breath, and chest pressure since being diagnosed with COVID-19 6 days ago.  He is not in any respiratory distress and is maintaining O2 sats on room air.  Chest pain is primarily with a deep breath, EKG without evidence of arrhythmia or ischemia.  We will add on troponin but I have a  low suspicion for ACS or PE.  Lab work thus far is reassuring, chest x-ray pending.  Chest x-ray reviewed by me and shows patchy infiltrates consistent with COVID-19 pneumonia.  Troponin within normal limits.  Patient continues to breathe comfortably and maintain O2 sats on room air.  He is appropriate for discharge home with PCP follow-up.  Patient counseled to return to the ED for new or worsening symptoms.      ____________________________________________   FINAL CLINICAL IMPRESSION(S) / ED DIAGNOSES  Final diagnoses:  Pneumonia due to COVID-19 virus  Chest pain, unspecified type     ED Discharge Orders         Ordered    albuterol (VENTOLIN HFA) 108 (90 Base) MCG/ACT inhaler  Every 6 hours PRN       Note to Pharmacy: Please supply with spacer   11/25/20 1704           Note:  This document was prepared using Dragon voice recognition software and may include unintentional dictation errors.   Blake Divine, MD 11/25/20 1705

## 2020-11-26 ENCOUNTER — Telehealth: Payer: Self-pay

## 2020-11-26 NOTE — Telephone Encounter (Signed)
He said he was basically following up to see if an antibiotic was needed. I explained the message to him and I asked that he call with an update on his symptoms on Monday.

## 2020-11-26 NOTE — Telephone Encounter (Signed)
I have reviewed his chart. The hospital ED has already explained his diagnosis.  Please remind him that he was diagnosed with COVID19 Pneumonia, which is a common complication of [id number].  It is a "viral" pneumonia. Antibiotics are not always recommended. The ED would typically prescribe an antibiotic if it was necessary.  If he wants to discuss further or if he is not improved by next week, then we can schedule him for a Virtual Visit to discuss his ED visit / Follow-up and he may warrant antibiotic if not improved by that point.  Nobie Putnam, [facility] 11/26/2020, 2:28 PM

## 2020-11-26 NOTE — Telephone Encounter (Signed)
Copied from [place] [id number]. Topic: General - Inquiry >> Nov 26, 2020  9:54 AM Greggory Keen D wrote: Reason for CRM: Pt called saying he was in the ER yesterday and tested positive for Covid .  He was not prescribed antibiotic but was told he does have a little bit of pneumonia.  He wants to know if Dr Raliegh Ip will prescribe an antibiotic  [facility]     CB#  [phone]

## 2021-04-06 ENCOUNTER — Encounter: Payer: Self-pay | Admitting: Internal Medicine

## 2021-04-06 ENCOUNTER — Telehealth (INDEPENDENT_AMBULATORY_CARE_PROVIDER_SITE_OTHER): Payer: 59 | Admitting: Internal Medicine

## 2021-04-06 ENCOUNTER — Other Ambulatory Visit: Payer: Self-pay

## 2021-04-06 DIAGNOSIS — R519 Headache, unspecified: Secondary | ICD-10-CM | POA: Diagnosis not present

## 2021-04-06 DIAGNOSIS — R059 Cough, unspecified: Secondary | ICD-10-CM

## 2021-04-06 DIAGNOSIS — R0989 Other specified symptoms and signs involving the circulatory and respiratory systems: Secondary | ICD-10-CM | POA: Diagnosis not present

## 2021-04-06 DIAGNOSIS — J029 Acute pharyngitis, unspecified: Secondary | ICD-10-CM

## 2021-04-06 DIAGNOSIS — R448 Other symptoms and signs involving general sensations and perceptions: Secondary | ICD-10-CM | POA: Diagnosis not present

## 2021-04-06 MED ORDER — PREDNISONE 10 MG PO TABS: ORAL_TABLET | ORAL | 0 refills | Status: DC

## 2021-04-06 NOTE — Progress Notes (Signed)
Virtual Visit via Video Note  I connected with Garrett Mcmahon on 04/06/21 at 11:20 AM EDT by a video enabled telemedicine application and verified that I am speaking with the correct person using two identifiers.  Location: Patient: Home Provider: Office  Persons participating in this video call: Garrett Silversmith, NP and Garrett Mcmahon.   I discussed the limitations of evaluation and management by telemedicine and the availability of in person appointments. The patient expressed understanding and agreed to proceed.  History of Present Illness:  Patient reports headache, facial pain and pressure, runny nose, sore throat, cough and chest congestion.  He reports this started 3 days ago.  The headache is located in his forehead and in the back of his head.  He describes the pain as pressure.  He denies associated dizziness or visual changes.  He is blowing clear mucus out of his nose.  He denies difficulty swallowing.  The cough is productive of brown mucus at times.  He denies eye redness or discharge, nasal congestion, ear pain or shortness of breath.  He denies fever, chills, nausea, vomiting or diarrhea.  He has taken an antihistamine OTC with minimal relief of symptoms.  He reports history of seasonal allergies.  He has not had his COVID-vaccine is unsure if he has had exposure or not.   Past Medical History:  Diagnosis Date  . Family history of MI (myocardial infarction) 01/18/2017   Age- 50's  . H/O shoulder surgery   . Inguinal hernia 1971   Repaired at 2 months old   . Tobacco use     Current Outpatient Medications  Medication Sig Dispense Refill  . aspirin-acetaminophen-caffeine (EXCEDRIN MIGRAINE) 250-250-65 MG tablet Take 2 tablets by mouth at bedtime.    . phenylephrine (SUDAFED PE) 10 MG TABS tablet Take 10 mg by mouth every 4 (four) hours as needed.    . predniSONE (DELTASONE) 10 MG tablet Take 6 tabs on day 1, 5 tabs on day 2, 4 tabs on day 3, 3 tabs on day 4, 2 tabs on day 5, 1  tab on day 6 21 tablet 0  . Pseudoephedrine-Naproxen Na (SINUS & COLD-D PO) Take by mouth.    Marland Kitchen albuterol (VENTOLIN HFA) 108 (90 Base) MCG/ACT inhaler Inhale 2 puffs into the lungs every 6 (six) hours as needed for wheezing or shortness of breath. (Patient not taking: Reported on 04/06/2021) 8 g 0  . fluticasone (FLONASE) 50 MCG/ACT nasal spray Place 2 sprays into both nostrils daily. Use for 4-6 weeks then stop and use seasonally or as needed. (Patient not taking: Reported on 04/06/2021) 16 g 3   No current facility-administered medications for this visit.    No Known Allergies  Family History  Problem Relation Age of Onset  . COPD Mother   . Bipolar disorder Father   . Alcohol abuse Father   . Heart attack Maternal Grandmother   . Breast cancer Maternal Grandmother   . Heart disease Maternal Grandmother   . Heart disease Paternal Grandfather   . Heart attack Paternal Grandfather   . Lung cancer Paternal Grandfather        Smoker  . Cancer Paternal Grandfather        Lung  . Heart disease Maternal Uncle   . Cancer Paternal Grandmother        breast  . Prostate cancer Neg Hx   . Colon cancer Neg Hx     Social History   Socioeconomic History  . Marital status: Married  Spouse name: Not on file  . Number of children: Not on file  . Years of education: [employer]  . Highest education level: High school graduate  Occupational History  . 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 Smoking status: Never Smoker  . Smokeless tobacco: Current User    Types: Chew  . Tobacco comment: 1 can dip per day for 26+ years  Vaping Use  . Vaping Use: Never used  Substance and Sexual Activity  . Alcohol use: No  . Drug use: No  . Sexual activity: Not on file  Other Topics Concern  . Not on file  Social History Narrative  . Not on file   Social Determinants of Health   Financial Resource Strain: Not on file  Food Insecurity: Not on file  Transportation Needs: Not on file   Physical Activity: Not on file  Stress: Not on file  Social Connections: Not on file  Intimate Partner Violence: Not on file     Constitutional: Patient reports headache.  Denies fever, malaise, fatigue, or abrupt weight changes.  HEENT: Patient reports facial pain and pressure, runny nose, sore throat.  Denies eye pain, eye redness, ear pain, ringing in the ears, wax buildup, nasal congestion, bloody nose. Respiratory: Patient reports cough and chest congestion.  Denies difficulty breathing, shortness of breath.   Cardiovascular: Denies chest pain, chest tightness, palpitations or swelling in the hands or feet.   No other specific complaints in a complete review of systems (except as listed in HPI above).  Observations/Objective:   Wt Readings from Last 3 Encounters:  11/25/20 220 lb (99.8 kg)  04/10/19 229 lb (103.9 kg)  12/24/18 226 lb 12.8 oz (102.9 kg)    General: Appears his stated age, appears to not feel well but in NAD. HEENT: Head: normal shape and size, patient reports frontal sinus pressure; Nose: No congestion noted; Throat/Mouth: Hoarseness noted.  Pulmonary/Chest: Normal effort. No respiratory distress.  Neurological: Alert and oriented.    BMET    Component Value Date/Time   NA 140 11/25/2020 1309   K 3.7 11/25/2020 1309   CL 103 11/25/2020 1309   CO2 27 11/25/2020 1309   GLUCOSE 94 11/25/2020 1309   BUN 9 11/25/2020 1309   CREATININE 0.85 11/25/2020 1309   CREATININE 1.01 04/07/2020 0830   CALCIUM 8.7 (L) 11/25/2020 1309   GFRNONAA >60 11/25/2020 1309   GFRNONAA 86 04/07/2020 0830   GFRAA 100 04/07/2020 0830    Lipid Panel     Component Value Date/Time   CHOL 216 (H) 04/07/2020 0830   TRIG 161 (H) 04/07/2020 0830   HDL 39 (L) 04/07/2020 0830   CHOLHDL 5.5 (H) 04/07/2020 0830   LDLCALC 147 (H) 04/07/2020 0830    CBC    Component Value Date/Time   WBC 7.3 11/25/2020 1309   RBC 4.91 11/25/2020 1309   HGB 15.8 11/25/2020 1309   HCT 46.6  11/25/2020 1309   PLT 197 11/25/2020 1309   MCV 94.9 11/25/2020 1309   MCH 32.2 11/25/2020 1309   MCHC 33.9 11/25/2020 1309   RDW 12.6 11/25/2020 1309   LYMPHSABS 2,470 04/07/2020 0830   EOSABS 388 04/07/2020 0830   BASOSABS 53 04/07/2020 0830    Hgb A1C Lab Results  Component Value Date   HGBA1C 5.3 04/07/2020       Assessment and Plan: Acute Headache, Facial Pain and Pressure, Runny Nose, Sore Throat, Cough:  DDx include allergic rhinitis, viral sinusitis, viral URI with cough,  influenza, COVID He will do a home COVID test and let me know the results Encouraged rest and fluids Rx for Pred taper x6 days for symptom management No indication for antibiotics at this time Encouraged him to continue oral antihistamine OTC  Follow Up Instructions:    I discussed the assessment and treatment plan with the patient. The patient was provided an opportunity to ask questions and all were answered. The patient agreed with the plan and demonstrated an understanding of the instructions.   The patient was advised to call back or seek an in-person evaluation if the symptoms worsen or if the condition fails to improve as anticipated.   Garrett Silversmith, NP

## 2021-04-06 NOTE — Patient Instructions (Signed)

## 2021-05-13 IMAGING — CR DG CHEST 2V
1 series · 2 of 2 positions shown · non-contrast
Comparison: None.

CLINICAL DATA: Shortness of breath

EXAM:
CHEST - 2 VIEW

[Series 1: w chest pa · 0.14mm/px · 2 of 2 slices shown]
[im 1/2]
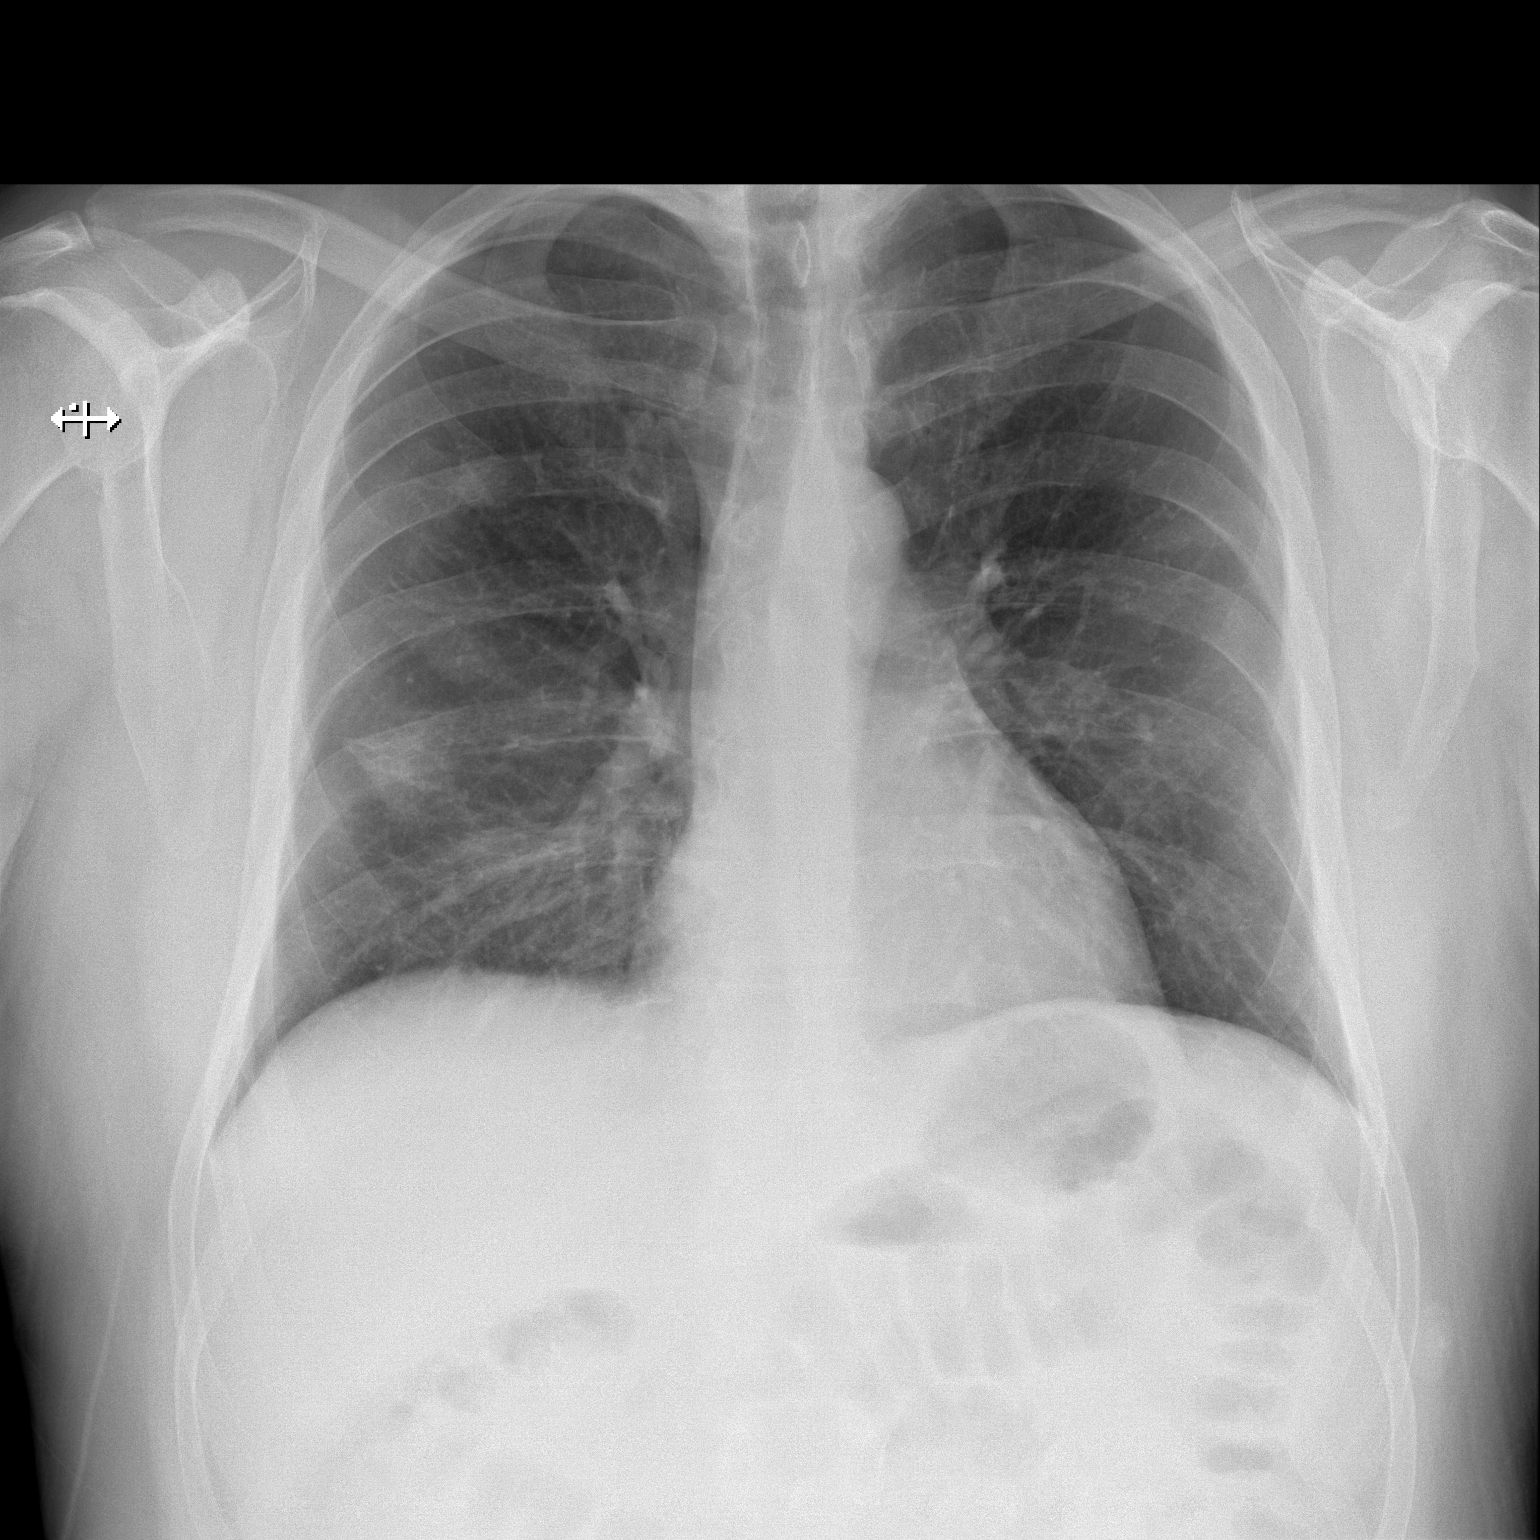
[im 2/2]
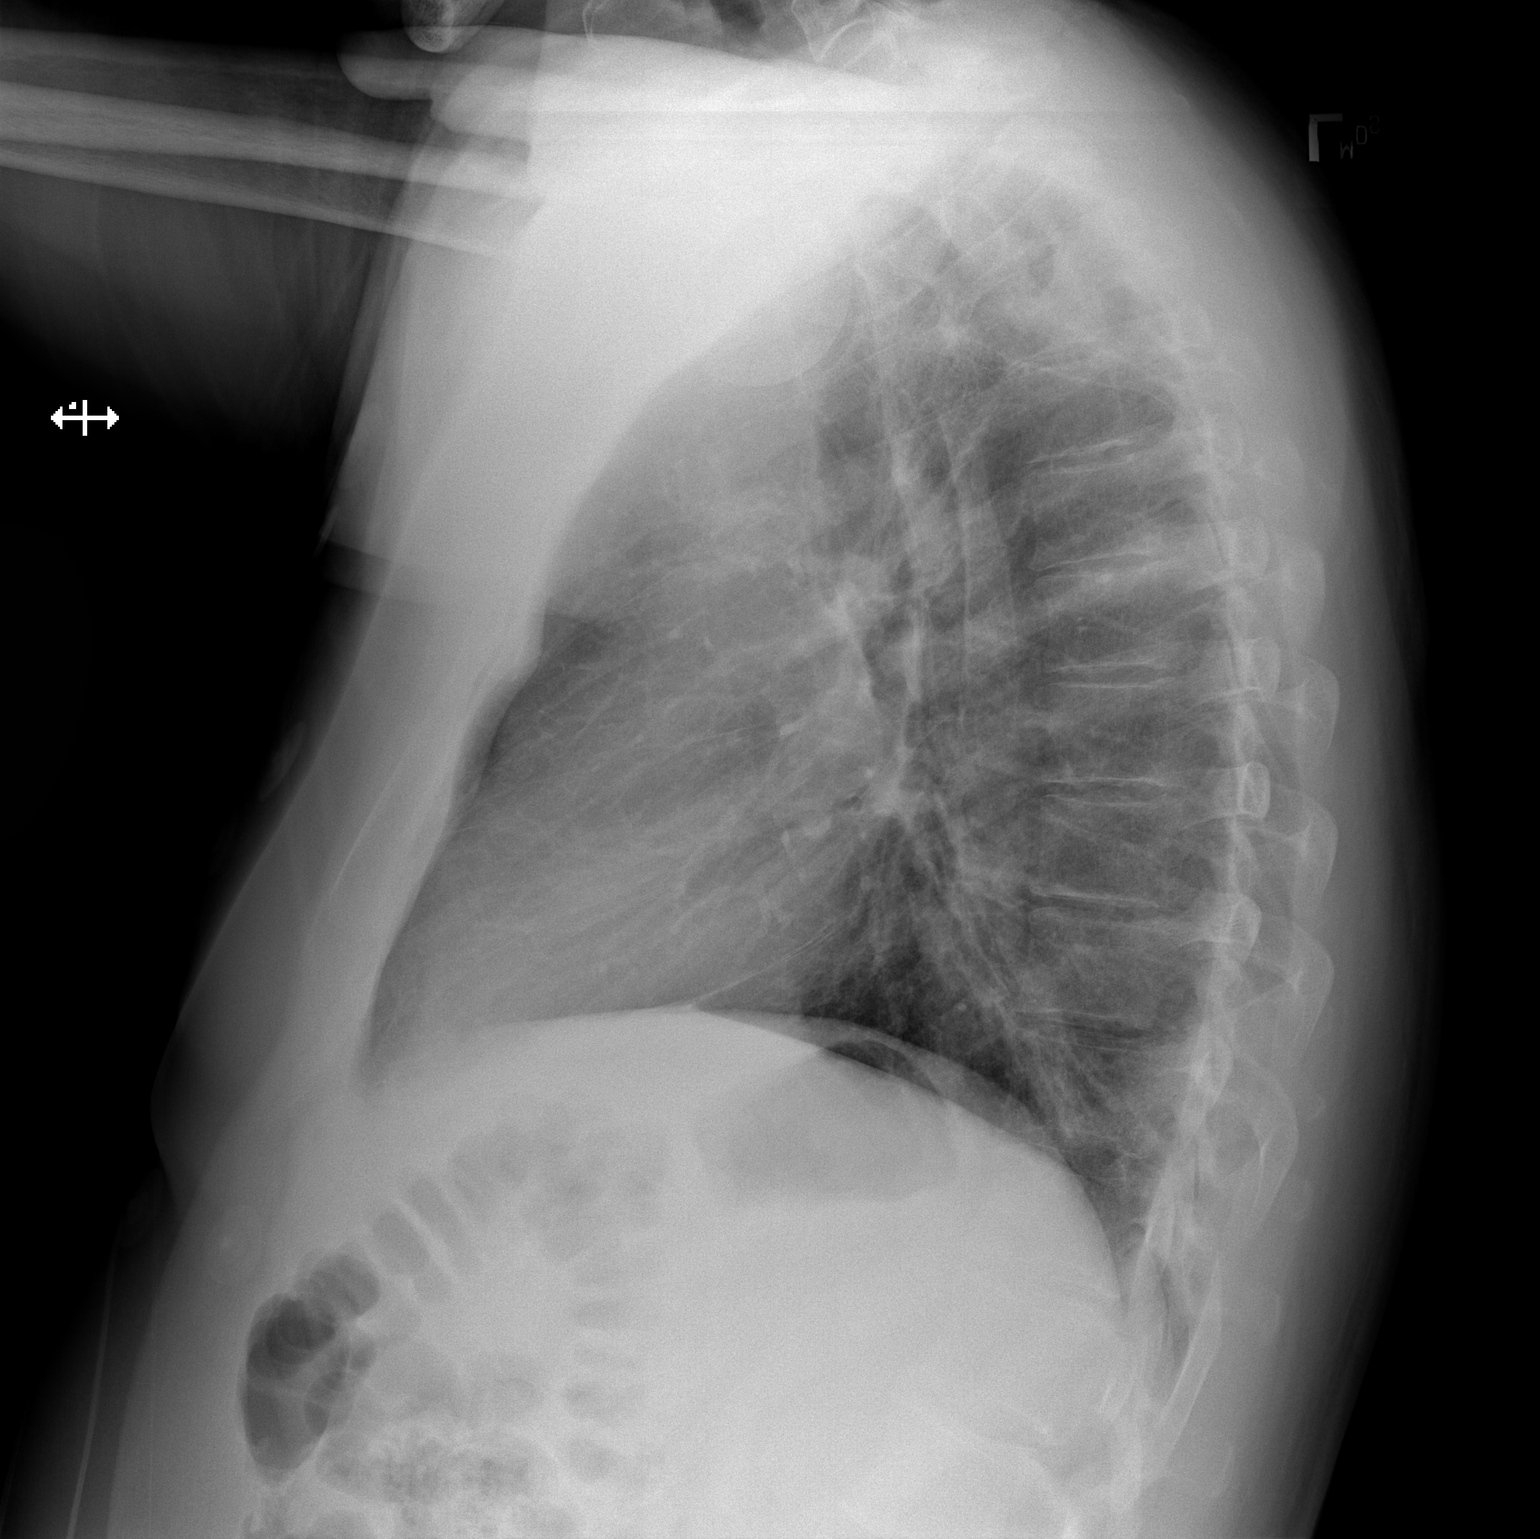

[2 of 2 positions shown; findings below may reference images not displayed]

FINDINGS: There is ill-defined airspace opacity in portions of the right upper
and right mid lung regions. Lungs elsewhere are clear. Heart size
and pulmonary vascularity are normal. No adenopathy. No bone
lesions.
IMPRESSION: Ill-defined airspace opacity in portions of the right upper and
right mid lung regions consistent with foci of pneumonia. Question
atypical organism pneumonia; advise check of [id number] status. Lungs
elsewhere clear. Heart size normal.

## 2021-07-08 ENCOUNTER — Other Ambulatory Visit: Payer: Self-pay

## 2021-07-08 ENCOUNTER — Other Ambulatory Visit: Payer: Self-pay | Admitting: Family Medicine

## 2021-07-08 ENCOUNTER — Encounter: Payer: Self-pay | Admitting: Family Medicine

## 2021-07-08 ENCOUNTER — Ambulatory Visit: Payer: 59 | Admitting: Family Medicine

## 2021-07-08 VITALS — BP 103/77 | HR 70 | Ht 71.0 in | Wt 222.2 lb

## 2021-07-08 DIAGNOSIS — D1723 Benign lipomatous neoplasm of skin and subcutaneous tissue of right leg: Secondary | ICD-10-CM

## 2021-07-08 DIAGNOSIS — Z125 Encounter for screening for malignant neoplasm of prostate: Secondary | ICD-10-CM

## 2021-07-08 DIAGNOSIS — Z0000 Encounter for general adult medical examination without abnormal findings: Secondary | ICD-10-CM

## 2021-07-08 DIAGNOSIS — E7800 Pure hypercholesterolemia, unspecified: Secondary | ICD-10-CM

## 2021-07-08 NOTE — Patient Instructions (Addendum)
Thank you for coming to the office today.  This is likely a Lipoma, benign fatty tissue growth. Keep an eye on it. Make sure it says relatively same size 1 cm. And keep an eye if any other spots develop or new symptoms.   DUE for FASTING BLOOD WORK (no food or drink after midnight before the lab appointment, only water or coffee without cream/sugar on the morning of)  SCHEDULE "Lab Only" visit in the morning at the clinic for lab draw in 6 MONTHS   - Make sure Lab Only appointment is at about 1 week before your next appointment, so that results will be available  For Lab Results, once available within 2-3 days of blood draw, you can can log in to MyChart online to view your results and a brief explanation. Also, we can discuss results at next follow-up visit.    Please schedule a Follow-up Appointment to: Return in about 6 weeks (around 08/19/2021) for 6 week fasting lab only then 1 week later Annual Physical.  If you have any other questions or concerns, please feel free to call the office or send a message through [place]. You may also schedule an earlier appointment if necessary.  Additionally, you may be receiving a survey about your experience at our office within a few days to 1 week by e-mail or mail. We value your feedback.  Nobie Putnam, DO [facility]  Lipoma A lipoma is a noncancerous (benign) tumor that is made up of fat cells. This is a very common type of soft-tissue growth. Lipomas are usually found under the skin (subcutaneous). They may occur in any tissue of the body that contains fat. Common areas for lipomas to appear include the back, arms, shoulders, buttocks, and thighs. Lipomas grow slowly, and they are usually painless. Most lipomas do not cause problems and do not require treatment. What are the causes? The cause of this condition is not known. What increases the risk? You are more likely to develop this condition if: You are  47-64 years old. You have a family history of lipomas. What are the signs or symptoms? A lipoma usually appears as a small, round bump under the skin. In most cases, the lump will: Feel soft or rubbery. Not cause pain or other symptoms. However, if a lipoma is located in an area where it pushes on nerves, it can become painful or cause other symptoms. How is this diagnosed? A lipoma can usually be diagnosed with a physical exam. You may also have tests to confirm the diagnosis and to rule out other conditions. Tests may include: Imaging tests, such as a CT scan or an MRI. Removal of a tissue sample to be looked at under a microscope (biopsy). How is this treated? Treatment for this condition depends on the size of the lipoma and whether it is causing any symptoms. For small lipomas that are not causing problems, no treatment is needed. If a lipoma is bigger or it causes problems, surgery may be done to remove the lipoma. Lipomas can also be removed to improve appearance. Most often, the procedure is done after applying a medicine that numbs the area (local anesthetic). Liposuction may be done to reduce the size of the lipoma before it is removed through surgery, or it may be done to remove the lipoma. Lipomas are removed with this method in order to limit incision size and scarring. A liposuction tube is inserted through a small incision into the lipoma, and  the contents of the lipoma are removed through the tube with suction. Follow these instructions at home: Watch your lipoma for any changes. Keep all follow-up visits as told by your health care provider. This is important. Contact a health care provider if: Your lipoma becomes larger or hard. Your lipoma becomes painful, red, or increasingly swollen. These could be signs of infection or a more serious condition. Get help right away if: You develop tingling or numbness in an area near the lipoma. This could indicate that the lipoma is  causing nerve damage. Summary A lipoma is a noncancerous tumor that is made up of fat cells. Most lipomas do not cause problems and do not require treatment. If a lipoma is bigger or it causes problems, surgery may be done to remove the lipoma. Contact a health care provider if your lipoma becomes larger or hard, or if it becomes painful, red, or increasingly swollen. Pain, redness, and swelling could be signs of infection or a more serious condition. This information is not intended to replace advice given to you by your health care provider. Make sure you discuss any questions you have with your health care provider. Document Revised: 06/10/2019 Document Reviewed: 06/10/2019 Elsevier Patient Education  [place].

## 2021-07-08 NOTE — Progress Notes (Signed)
Subjective:    Patient ID: Garrett Mcmahon, male    DOB: [birth year], 50 y.o.   MRN: [id number]  Garrett WILKIN is a 50 y.o. male presenting on 07/08/2021 for Mass   HPI  Lipoma suspected Nodule, Right Inner Thigh Reports onset 3 weeks ago approximately felt a "bump" on his thigh. Identified it as abnormal to him. He tried to push on it to try to drain it. And he thought it was an ingrown hair. He had a slight bruise on this spot due to pressing on it. Otherwise non tender. No other spots or growths.   Depression screen [facility] 2/9 07/08/2021 08/20/2019 12/24/2018  Decreased Interest 0 1 0  Down, Depressed, Hopeless 0 0 0  PHQ - 2 Score 0 1 0  Altered sleeping 0 3 -  Tired, decreased energy 1 1 -  Change in appetite 0 1 -  Feeling bad or failure about yourself  0 0 -  Trouble concentrating 0 0 -  Moving slowly or fidgety/restless 1 1 -  Suicidal thoughts 0 0 -  PHQ-9 Score 2 7 -  Difficult doing work/chores Not difficult at all - -    Social History   Tobacco Use   Smoking status: Never   Smokeless tobacco: Current    Types: Chew   Tobacco comments:    1 can dip per day for 26+ years  Vaping Use   Vaping Use: Never used  Substance Use Topics   Alcohol use: No   Drug use: No    Review of Systems Per HPI unless specifically indicated above     Objective:    BP 103/77   Pulse 70   Ht '5\' 11"'$  (1.803 m)   Wt 222 lb 3.2 oz (100.8 kg)   SpO2 99%   BMI 30.99 kg/m   Wt Readings from Last 3 Encounters:  07/08/21 222 lb 3.2 oz (100.8 kg)  11/25/20 220 lb (99.8 kg)  04/10/19 229 lb (103.9 kg)    Physical Exam Vitals and nursing note reviewed.  Constitutional:      General: He is not in acute distress.    Appearance: Normal appearance. He is well-developed. He is not diaphoretic.     Comments: Well-appearing, comfortable, cooperative  HENT:     Head: Normocephalic and atraumatic.  Eyes:     General:        Right eye: No discharge.        Left eye: No discharge.      Conjunctiva/sclera: Conjunctivae normal.  Cardiovascular:     Rate and Rhythm: Normal rate.  Pulmonary:     Effort: Pulmonary effort is normal.  Lymphadenopathy:     Cervical: No cervical adenopathy.     Right cervical: No superficial, deep or posterior cervical adenopathy.    Left cervical: No superficial, deep or posterior cervical adenopathy.     Upper Body:     Right upper body: No supraclavicular or axillary adenopathy.     Left upper body: No supraclavicular or axillary adenopathy.     Lower Body: No right inguinal adenopathy. No left inguinal adenopathy.  Skin:    General: Skin is warm and dry.     Findings: Lesion (1 x 1 cm superficial mobile soft well defined nodule density. likely lipoma R inner thigh mid thigh) present. No erythema or rash.  Neurological:     Mental Status: He is alert and oriented to person, place, and time.  Psychiatric:  Mood and Affect: Mood normal.        Behavior: Behavior normal.        Thought Content: Thought content normal.     Comments: Well groomed, good eye contact, normal speech and thoughts   Results for orders placed or performed during the hospital encounter of [id number]  Basic metabolic panel  Result Value Ref Range   Sodium 140 135 - 145 mmol/L   Potassium 3.7 3.5 - 5.1 mmol/L   Chloride 103 98 - 111 mmol/L   CO2 27 22 - 32 mmol/L   Glucose, Bld 94 70 - 99 mg/dL   BUN 9 6 - 20 mg/dL   Creatinine, Ser 0.85 0.61 - 1.24 mg/dL   Calcium 8.7 (L) 8.9 - 10.3 mg/dL   GFR, Estimated >60 >60 mL/min   Anion gap 10 5 - 15  CBC  Result Value Ref Range   WBC 7.3 4.0 - 10.5 K/uL   RBC 4.91 4.22 - 5.81 MIL/uL   Hemoglobin 15.8 13.0 - 17.0 g/dL   HCT 46.6 39.0 - 52.0 %   MCV 94.9 80.0 - 100.0 fL   MCH 32.2 26.0 - 34.0 pg   MCHC 33.9 30.0 - 36.0 g/dL   RDW 12.6 11.5 - 15.5 %   Platelets 197 150 - 400 K/uL   nRBC 0.0 0.0 - 0.2 %  Troponin I (High Sensitivity)  Result Value Ref Range   Troponin I (High Sensitivity) 7 <18 ng/L       Assessment & Plan:   Problem List Items Addressed This Visit   None Visit Diagnoses     Lipoma of right thigh    -  Primary       Clinically most likely benign lipoma, isolated finding onset 3 weeks No associated symptoms or other concerns. Reassurance Reviewed likely diagnosis Observation and monitoring now Follow up as planned or sooner if changes or concerns.  No orders of the defined types were placed in this encounter.    Follow up plan: Return in about 6 weeks (around 08/19/2021) for 6 week fasting lab only then 1 week later Annual Physical.  6 week fasting lab only then 1 week later Annual Physical  Garrett Mcmahon, [facility] 07/08/2021, 10:59 AM

## 2021-08-18 ENCOUNTER — Other Ambulatory Visit: Payer: Self-pay

## 2021-08-18 DIAGNOSIS — E7800 Pure hypercholesterolemia, unspecified: Secondary | ICD-10-CM

## 2021-08-18 DIAGNOSIS — Z125 Encounter for screening for malignant neoplasm of prostate: Secondary | ICD-10-CM

## 2021-08-18 DIAGNOSIS — Z0000 Encounter for general adult medical examination without abnormal findings: Secondary | ICD-10-CM

## 2021-08-19 ENCOUNTER — Other Ambulatory Visit: Payer: 59

## 2021-08-19 ENCOUNTER — Other Ambulatory Visit: Payer: Self-pay

## 2021-08-20 LAB — LIPID PANEL
Cholesterol: 182 mg/dL (ref <200)
HDL: 50 mg/dL (ref >=40)
LDL Cholesterol (Calc): 114 mg/dL (calc) — ABNORMAL HIGH
Non-HDL Cholesterol (Calc): 132 mg/dL (calc) — ABNORMAL HIGH (ref <130)
Total CHOL/HDL Ratio: 3.6 (calc) (ref <5.0)
Triglycerides: 85 mg/dL (ref <150)

## 2021-08-20 LAB — CBC WITH DIFFERENTIAL/PLATELET
Absolute Monocytes: 728 cells/uL (ref 200–950)
Basophils Absolute: 42 cells/uL (ref 0–200)
Basophils Relative: 0.6 %
Eosinophils Absolute: 308 cells/uL (ref 15–500)
Eosinophils Relative: 4.4 %
HCT: 47.3 % (ref 38.5–50.0)
Hemoglobin: 15.7 g/dL (ref 13.2–17.1)
Lymphs Abs: 2233 cells/uL (ref 850–3900)
MCH: 32 pg (ref 27.0–33.0)
MCHC: 33.2 g/dL (ref 32.0–36.0)
MCV: 96.3 fL (ref 80.0–100.0)
MPV: 11 fL (ref 7.5–12.5)
Monocytes Relative: 10.4 %
Neutro Abs: 3689 cells/uL (ref 1500–7800)
Neutrophils Relative %: 52.7 %
Platelets: 262 10*3/uL (ref 140–400)
RBC: 4.91 10*6/uL (ref 4.20–5.80)
RDW: 12.2 % (ref 11.0–15.0)
Total Lymphocyte: 31.9 %
WBC: 7 10*3/uL (ref 3.8–10.8)

## 2021-08-20 LAB — COMPLETE METABOLIC PANEL WITH GFR
AG Ratio: 1.7 (calc) (ref 1.0–2.5)
ALT: 16 U/L (ref 9–46)
AST: 12 U/L (ref 10–35)
Albumin: 4 g/dL (ref 3.6–5.1)
Alkaline phosphatase (APISO): 58 U/L (ref 35–144)
BUN: 11 mg/dL (ref 7–25)
CO2: 26 mmol/L (ref 20–32)
Calcium: 8.8 mg/dL (ref 8.6–10.3)
Chloride: 105 mmol/L (ref 98–110)
Creat: 0.97 mg/dL (ref 0.70–1.30)
Globulin: 2.3 g/dL (calc) (ref 1.9–3.7)
Glucose, Bld: 90 mg/dL (ref 65–99)
Potassium: 4.7 mmol/L (ref 3.5–5.3)
Sodium: 140 mmol/L (ref 135–146)
Total Bilirubin: 0.4 mg/dL (ref 0.2–1.2)
Total Protein: 6.3 g/dL (ref 6.1–8.1)
eGFR: 95 mL/min/{1.73_m2} (ref >=60)

## 2021-08-20 LAB — PSA: PSA: 0.16 ng/mL (ref <=4.00)

## 2021-08-20 LAB — HEMOGLOBIN A1C
Hgb A1c MFr Bld: 5.4 % of total Hgb (ref <5.7)
Mean Plasma Glucose: 108 mg/dL
eAG (mmol/L): 6 mmol/L

## 2021-08-26 ENCOUNTER — Ambulatory Visit (INDEPENDENT_AMBULATORY_CARE_PROVIDER_SITE_OTHER): Payer: 59 | Admitting: Family Medicine

## 2021-08-26 ENCOUNTER — Other Ambulatory Visit: Payer: Self-pay | Admitting: Family Medicine

## 2021-08-26 ENCOUNTER — Encounter: Payer: Self-pay | Admitting: Family Medicine

## 2021-08-26 ENCOUNTER — Other Ambulatory Visit: Payer: Self-pay

## 2021-08-26 VITALS — BP 129/72 | HR 88 | Ht 71.0 in | Wt 228.0 lb

## 2021-08-26 DIAGNOSIS — Z0000 Encounter for general adult medical examination without abnormal findings: Secondary | ICD-10-CM

## 2021-08-26 DIAGNOSIS — Z23 Encounter for immunization: Secondary | ICD-10-CM

## 2021-08-26 DIAGNOSIS — E7800 Pure hypercholesterolemia, unspecified: Secondary | ICD-10-CM

## 2021-08-26 DIAGNOSIS — Z1211 Encounter for screening for malignant neoplasm of colon: Secondary | ICD-10-CM | POA: Diagnosis not present

## 2021-08-26 DIAGNOSIS — E669 Obesity, unspecified: Secondary | ICD-10-CM

## 2021-08-26 DIAGNOSIS — Z125 Encounter for screening for malignant neoplasm of prostate: Secondary | ICD-10-CM

## 2021-08-26 NOTE — Assessment & Plan Note (Signed)
Mostly controlled cholesterol on lifestyle - improved notably Last lipid panel 08/2021 The 10-year ASCVD risk score (Arnett DK, et al., 2019) is: 3.4%   Plan: 1. Not on medication, not indicated at this time 2. Encourage improved lifestyle - low carb/cholesterol, reduce portion size, continue improving regular exercise  F/u yearly lipid

## 2021-08-26 NOTE — Progress Notes (Signed)
Subjective:    Patient ID: Garrett Mcmahon, male    DOB: September 22, 1971, 50 y.o.   MRN: [id number]  VERGIL Mcmahon is a 50 y.o. male presenting on 08/26/2021 for Annual Exam   HPI  Here for Annual Physical and Lab Review.    LIFESTYLE / WELLNESS / HYPERLIPIDEMIA / OBESITY BMI >31 - Reports no new concerns. Last lipid panel 08/2021, mostly normal except mild elevated LDL - major improvement overall. 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   PMH: - S/p Left Shoulder Surgery, rotator cuff repair. He remains out of work still on leave due to this surgery. 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 Currently with mild weaker stream and nocturia but mostly during day functions well. No known family history of prostate CA.    Colon CA Screening: Never had colonoscopy. Currently asymptomatic. No known family history of colon CA. Due for screening test Cologuard, counseling given   Due for Flu Shot and COVID19 vaccine, declines today despite counseling on benefits   Depression screen [facility] 2/9 08/26/2021 07/08/2021 08/20/2019  Decreased Interest 0 0 1  Down, Depressed, Hopeless 1 0 0  PHQ - 2 Score 1 0 1  Altered sleeping 0 0 3  Tired, decreased energy 2 1 1   Change in appetite 2 0 1  Feeling bad or failure about yourself  0 0 0  Trouble concentrating 0 0 0  Moving slowly or fidgety/restless 0 1 1  Suicidal thoughts 0 0 0  PHQ-9 Score 5 2 7   Difficult doing work/chores Somewhat difficult Not difficult at all -    Past Medical History:  Diagnosis Date   Family history of MI (myocardial infarction) 01/18/2017    Age- 19's   H/O shoulder surgery    Inguinal hernia 1971   Repaired at 2 months old    Tobacco use    Past Surgical History:  Procedure Laterality Date   INGUINAL HERNIA REPAIR Right 1971   Repaired at 78 months old   KNEE ARTHROSCOPY Right    x2   KNEE ARTHROSCOPY W/ ACL RECONSTRUCTION Right    Social History   Socioeconomic History   Marital status: Married    Spouse name: Not on file   Number of children: Not on file   Years of education: High School   Highest education level: High school graduate  Occupational History   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  Tobacco Use   Smoking status: Never   Smokeless tobacco: Current    Types: Chew   Tobacco comments:    1 can dip per day for 26+ years  Vaping Use   Vaping Use: Never used  Substance and Sexual Activity   Alcohol use: No   Drug use: No   Sexual activity: Not on file  Other Topics Concern   Not on file  Social History Narrative   Not on file   Social Determinants of Health   Financial Resource Strain: Not on file  Food Insecurity: Not on file  Transportation Needs: Not on file  Physical Activity: Not on file  Stress: Not on file  Social Connections: Not on  file  Intimate Partner Violence: Not on file   Family History  Problem Relation Age of Onset   COPD Mother    Bipolar disorder Father    Alcohol abuse Father    Heart attack Maternal Grandmother    Breast cancer Maternal Grandmother    Heart disease Maternal Grandmother    Heart disease Paternal Grandfather    Heart attack Paternal Grandfather    Lung cancer Paternal Grandfather        Smoker   Cancer Paternal Grandfather        Lung   Heart disease Maternal Uncle    Cancer Paternal Grandmother        breast   Prostate cancer Neg Hx    Colon cancer Neg Hx    No current outpatient medications on file prior to visit.   No current facility-administered medications on file prior to visit.    Review of Systems  Constitutional:  Negative for  activity change, appetite change, chills, diaphoresis, fatigue and fever.  HENT:  Negative for congestion and hearing loss.   Eyes:  Negative for visual disturbance.  Respiratory:  Negative for cough, chest tightness, shortness of breath and wheezing.   Cardiovascular:  Negative for chest pain, palpitations and leg swelling.  Gastrointestinal:  Negative for abdominal pain, constipation, diarrhea, nausea and vomiting.  Endocrine: Negative for cold intolerance.  Genitourinary:  Negative for difficulty urinating, dysuria, frequency and hematuria.       Nocturia, occasional weaker stream   Musculoskeletal:  Negative for arthralgias and neck pain.  Skin:  Negative for rash.  Allergic/Immunologic: Negative for environmental allergies.  Neurological:  Negative for dizziness, weakness, light-headedness, numbness and headaches.  Hematological:  Negative for adenopathy.  Psychiatric/Behavioral:  Negative for behavioral problems, dysphoric mood and sleep disturbance.   Per HPI unless specifically indicated above      Objective:    BP 129/72   Pulse 88   Ht 5' 11"  (1.803 m)   Wt 228 lb (103.4 kg)   SpO2 94%   BMI 31.80 kg/m   Wt Readings from Last 3 Encounters:  08/26/21 228 lb (103.4 kg)  07/08/21 222 lb 3.2 oz (100.8 kg)  11/25/20 220 lb (99.8 kg)    Physical Exam Vitals and nursing note reviewed.  Constitutional:      General: He is not in acute distress.    Appearance: He is well-developed. He is not diaphoretic.     Comments: Well-appearing, comfortable, cooperative  HENT:     Head: Normocephalic and atraumatic.  Eyes:     General:        Right eye: No discharge.        Left eye: No discharge.     Conjunctiva/sclera: Conjunctivae normal.     Pupils: Pupils are equal, round, and reactive to light.  Neck:     Thyroid: No thyromegaly.     Vascular: No carotid bruit.  Cardiovascular:     Rate and Rhythm: Normal rate and regular rhythm.     Pulses: Normal pulses.     Heart  sounds: Normal heart sounds. No murmur heard. Pulmonary:     Effort: Pulmonary effort is normal. No respiratory distress.     Breath sounds: Normal breath sounds. No wheezing or rales.  Abdominal:     General: Bowel sounds are normal. There is no distension.     Palpations: Abdomen is soft. There is no mass.     Tenderness: There is no abdominal tenderness.  Musculoskeletal:  General: No tenderness. Normal range of motion.     Cervical back: Normal range of motion and neck supple.     Right lower leg: No edema.     Left lower leg: No edema.     Comments: Upper / Lower Extremities: - Normal muscle tone, strength bilateral upper extremities 5/5, lower extremities 5/5  Lymphadenopathy:     Cervical: No cervical adenopathy.  Skin:    General: Skin is warm and dry.     Findings: No erythema or rash.  Neurological:     Mental Status: He is alert and oriented to person, place, and time.     Comments: Distal sensation intact to light touch all extremities  Psychiatric:        Mood and Affect: Mood normal.        Behavior: Behavior normal.        Thought Content: Thought content normal.     Comments: Well groomed, good eye contact, normal speech and thoughts     Results for orders placed or performed in visit on 08/18/21  PSA  Result Value Ref Range   PSA 0.16 < OR = 4.00 ng/mL  Hemoglobin A1c  Result Value Ref Range   Hgb A1c MFr Bld 5.4 <5.7 % of total Hgb   Mean Plasma Glucose 108 mg/dL   eAG (mmol/L) 6.0 mmol/L  CBC with Differential/Platelet  Result Value Ref Range   WBC 7.0 3.8 - 10.8 Thousand/uL   RBC 4.91 4.20 - 5.80 Million/uL   Hemoglobin 15.7 13.2 - 17.1 g/dL   HCT 47.3 38.5 - 50.0 %   MCV 96.3 80.0 - 100.0 fL   MCH 32.0 27.0 - 33.0 pg   MCHC 33.2 32.0 - 36.0 g/dL   RDW 12.2 11.0 - 15.0 %   Platelets 262 140 - 400 Thousand/uL   MPV 11.0 7.5 - 12.5 fL   Neutro Abs 3,689 1,500 - 7,800 cells/uL   Lymphs Abs 2,233 850 - 3,900 cells/uL   Absolute Monocytes 728  200 - 950 cells/uL   Eosinophils Absolute 308 15 - 500 cells/uL   Basophils Absolute 42 0 - 200 cells/uL   Neutrophils Relative % 52.7 %   Total Lymphocyte 31.9 %   Monocytes Relative 10.4 %   Eosinophils Relative 4.4 %   Basophils Relative 0.6 %  Lipid panel  Result Value Ref Range   Cholesterol 182 <200 mg/dL   HDL 50 > OR = 40 mg/dL   Triglycerides 85 <150 mg/dL   LDL Cholesterol (Calc) 114 (H) mg/dL (calc)   Total CHOL/HDL Ratio 3.6 <5.0 (calc)   Non-HDL Cholesterol (Calc) 132 (H) <130 mg/dL (calc)  COMPLETE METABOLIC PANEL WITH GFR  Result Value Ref Range   Glucose, Bld 90 65 - 99 mg/dL   BUN 11 7 - 25 mg/dL   Creat 0.97 0.70 - 1.30 mg/dL   eGFR 95 > OR = 60 mL/min/1.56m   BUN/Creatinine Ratio NOT APPLICABLE 6 - 22 (calc)   Sodium 140 135 - 146 mmol/L   Potassium 4.7 3.5 - 5.3 mmol/L   Chloride 105 98 - 110 mmol/L   CO2 26 20 - 32 mmol/L   Calcium 8.8 8.6 - 10.3 mg/dL   Total Protein 6.3 6.1 - 8.1 g/dL   Albumin 4.0 3.6 - 5.1 g/dL   Globulin 2.3 1.9 - 3.7 g/dL (calc)   AG Ratio 1.7 1.0 - 2.5 (calc)   Total Bilirubin 0.4 0.2 - 1.2 mg/dL   Alkaline phosphatase (APISO) 58  35 - 144 U/L   AST 12 10 - 35 U/L   ALT 16 9 - 46 U/L      Assessment & Plan:   Problem List Items Addressed This Visit     Obesity (BMI 30.0-34.9)   Hyperlipidemia    Mostly controlled cholesterol on lifestyle - improved notably Last lipid panel 08/2021 The 10-year ASCVD risk score (Arnett DK, et al., 2019) is: 3.4%   Plan: 1. Not on medication, not indicated at this time 2. Encourage improved lifestyle - low carb/cholesterol, reduce portion size, continue improving regular exercise  F/u yearly lipid      Other Visit Diagnoses     Annual physical exam    -  Primary   Need for diphtheria-tetanus-pertussis (Tdap) vaccine       Relevant Orders   Tdap vaccine greater than or equal to 7yo IM (Completed)   Screening for colon cancer       Relevant Orders   Cologuard       Updated  Health Maintenance information Tdap today Declines flu and covid vaccine Reviewed recent lab results with patient Encouraged improvement to lifestyle with diet and exercise Goal of weight loss  Due for routine colon cancer screening. Never had colonoscopy (not interested), no family history colon cancer. - Discussion today about recommendations for either Colonoscopy or Cologuard screening, benefits and risks of screening, interested in Cologuard, understands that if positive then recommendation is for diagnostic colonoscopy to follow-up. Ordered Cologuard today   No orders of the defined types were placed in this encounter.     Follow up plan: Return in about 1 year (around 08/26/2022) for 1 year fasting lab only then 1 week later Annual Physical.  future labs PSA ordered 08/2022  Nobie Putnam, [facility] 08/26/2021, 11:02 AM

## 2021-08-26 NOTE — Patient Instructions (Addendum)
Thank you for coming to the office today.  Tdap tetanus shot today  Great lab results. Overall . Good cholesterol as discussed. Normal sugar range.   DUE for FASTING BLOOD WORK (no food or drink after midnight before the lab appointment, only water or coffee without cream/sugar on the morning of)  SCHEDULE "Lab Only" visit in the morning at the clinic for lab draw in 1 YEAR  - Make sure Lab Only appointment is at about 1 week before your next appointment, so that results will be available  For Lab Results, once available within 2-3 days of blood draw, you can can log in to MyChart online to view your results and a brief explanation. Also, we can discuss results at next follow-up visit.    Please schedule a Follow-up Appointment to: Return in about 1 year (around 08/26/2022) for 1 year fasting lab only then 1 week later Annual Physical.  If you have any other questions or concerns, please feel free to call the office or send a message through [place]. You may also schedule an earlier appointment if necessary.  Additionally, you may be receiving a survey about your experience at our office within a few days to 1 week by e-mail or mail. We value your feedback.  Nobie Putnam, DO [place]

## 2022-01-26 ENCOUNTER — Encounter: Payer: Self-pay | Admitting: Internal Medicine

## 2022-01-26 ENCOUNTER — Ambulatory Visit (INDEPENDENT_AMBULATORY_CARE_PROVIDER_SITE_OTHER): Payer: 59 | Admitting: Internal Medicine

## 2022-01-26 ENCOUNTER — Other Ambulatory Visit: Payer: Self-pay

## 2022-01-26 VITALS — BP 124/76 | HR 73 | Temp 96.9°F | Wt 236.0 lb

## 2022-01-26 DIAGNOSIS — E6609 Other obesity due to excess calories: Secondary | ICD-10-CM

## 2022-01-26 DIAGNOSIS — Z6832 Body mass index (BMI) 32.0-32.9, adult: Secondary | ICD-10-CM

## 2022-01-26 DIAGNOSIS — E66811 Obesity, class 1: Secondary | ICD-10-CM

## 2022-01-26 DIAGNOSIS — M5442 Lumbago with sciatica, left side: Secondary | ICD-10-CM | POA: Diagnosis not present

## 2022-01-26 MED ORDER — METHOCARBAMOL 500 MG PO TABS: 500.0000 mg | ORAL_TABLET | Freq: Every evening | ORAL | 0 refills | Status: DC | PRN

## 2022-01-26 MED ORDER — PREDNISONE 10 MG PO TABS: ORAL_TABLET | ORAL | 0 refills | Status: DC

## 2022-01-26 NOTE — Assessment & Plan Note (Signed)
Encourage diet and exercise for weight loss 

## 2022-01-26 NOTE — Progress Notes (Signed)
? ?Subjective:  ? ? Patient ID: Garrett Mcmahon, male    DOB: 05-31-1971, 51 y.o.   MRN: [id number] ? ?HPI ? ?Patient presents to clinic today with complaint of acute left side low back pain.  This started 7-8 days ago.  He describes the pain as sharp and throbbing. The pain radiates into his left leg. He has had intermittent numbness, tingling and weakness of the left lower extremity. He denies abdominal pain, loss of bowel or bladder control. He has tried Ibuprofen OTC with some relief of symptoms. He has had prior minor back injuries but denies back surgeries.  ? ?Review of Systems ? ?   ?Past Medical History:  ?Diagnosis Date  ? Family history of MI (myocardial infarction) 01/18/2017  ? Age- 100's  ? H/O shoulder surgery   ? Inguinal hernia 1971  ? Repaired at 2 months old   ? Tobacco use   ? ? ?No current outpatient medications on file.  ? ?No current facility-administered medications for this visit.  ? ? ?No Known Allergies ? ?Family History  ?Problem Relation Age of Onset  ? COPD Mother   ? Bipolar disorder Father   ? Alcohol abuse Father   ? Heart attack Maternal Grandmother   ? Breast cancer Maternal Grandmother   ? Heart disease Maternal Grandmother   ? Heart disease Paternal Grandfather   ? Heart attack Paternal Grandfather   ? Lung cancer Paternal Grandfather   ?     Smoker  ? Cancer Paternal Grandfather   ?     Lung  ? Heart disease Maternal Uncle   ? Cancer Paternal Grandmother   ?     breast  ? Prostate cancer Neg Hx   ? Colon cancer Neg Hx   ? ? ?Social History  ? ?Socioeconomic History  ? Marital status: Married  ?  Spouse name: Not on file  ? Number of children: Not on file  ? Years of education: High School  ? Highest education level: High school graduate  ?Occupational History  ? 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 Smoking status: Never  ? Smokeless tobacco: Current  ?  Types: Chew  ? Tobacco comments:  ?  1 can dip per day for 26+ years  ?Vaping Use  ? Vaping Use: Never used   ?Substance and Sexual Activity  ? Alcohol use: No  ? Drug use: No  ? Sexual activity: Not on file  ?Other Topics Concern  ? Not on file  ?Social History Narrative  ? Not on file  ? ?Social Determinants of Health  ? ?Financial Resource Strain: Not on file  ?Food Insecurity: Not on file  ?Transportation Needs: Not on file  ?Physical Activity: Not on file  ?Stress: Not on file  ?Social Connections: Not on file  ?Intimate Partner Violence: Not on file  ? ? ? ?Constitutional: Denies fever, malaise, fatigue, headache or abrupt weight changes.  ?Respiratory: Denies difficulty breathing, shortness of breath, cough or sputum production.   ?Cardiovascular: Denies chest pain, chest tightness, palpitations or swelling in the hands or feet.  ?Gastrointestinal: Denies abdominal pain, bloating, constipation, diarrhea or blood in the stool.  ?GU: Denies urgency, frequency, pain with urination, burning sensation, blood in urine, odor or discharge. ?Musculoskeletal: Patient reports left side low back pain.  Denies decrease in range of motion, difficulty with gait, or joint swelling.  ?Skin: Denies redness, rashes, lesions or ulcercations.  ?Neurological: Patient reports intermittent numbness, tingling, weakness of his  left lower extremity.  Denies problems with balance and coordination.  ? ? ?No other specific complaints in a complete review of systems (except as listed in HPI above). ? ?Objective:  ? Physical Exam ?BP 124/76 (BP Location: Left Arm, Patient Position: Sitting, Cuff Size: Large)   Pulse 73   Temp (!) 96.9 ?F (36.1 ?C) (Temporal)   Wt 236 lb (107 kg)   SpO2 98%   BMI 32.92 kg/m?  ? ?Wt Readings from Last 3 Encounters:  ?08/26/21 228 lb (103.4 kg)  ?07/08/21 222 lb 3.2 oz (100.8 kg)  ?11/25/20 220 lb (99.8 kg)  ? ? ?General: Appears his stated age, obese, in NAD. ?Skin: Warm, dry and intact. ?Cardiovascular: Normal rate and rhythm. S1,S2 noted.  No murmur, rubs or gallops noted.  ?Pulmonary/Chest: Normal effort and  positive vesicular breath sounds. No respiratory distress. No wheezes, rales or ronchi noted.  ?Musculoskeletal: Normal flexion, extension, rotation and lateral bending of the spine.  Bony tenderness noted over the lumbar spine.  No pain with palpation of the left paralumbar muscles.  Strength 5/5 RLE.  Strength 4/5 LLE.  Can stand on tiptoes and heels.  No difficulty with gait.  ?Neurological: Alert and oriented.  Positive SLR on the left at 45 degrees. ? ?BMET ?   ?Component Value Date/Time  ? NA 140 08/19/2021 0818  ? K 4.7 08/19/2021 0818  ? CL 105 08/19/2021 0818  ? CO2 26 08/19/2021 0818  ? GLUCOSE 90 08/19/2021 0818  ? BUN 11 08/19/2021 0818  ? CREATININE 0.97 08/19/2021 0818  ? CALCIUM 8.8 08/19/2021 0818  ? GFRNONAA >60 11/25/2020 1309  ? GFRNONAA 86 04/07/2020 0830  ? GFRAA 100 04/07/2020 0830  ? ? ?Lipid Panel  ?   ?Component Value Date/Time  ? CHOL 182 08/19/2021 0818  ? TRIG 85 08/19/2021 0818  ? HDL 50 08/19/2021 0818  ? CHOLHDL 3.6 08/19/2021 0818  ? LDLCALC 114 (H) 08/19/2021 0818  ? ? ?CBC ?   ?Component Value Date/Time  ? WBC 7.0 08/19/2021 0818  ? RBC 4.91 08/19/2021 0818  ? HGB 15.7 08/19/2021 0818  ? HCT 47.3 08/19/2021 0818  ? PLT 262 08/19/2021 0818  ? MCV 96.3 08/19/2021 0818  ? MCH 32.0 08/19/2021 0818  ? MCHC 33.2 08/19/2021 0818  ? RDW 12.2 08/19/2021 0818  ? LYMPHSABS 2,233 08/19/2021 0818  ? EOSABS 308 08/19/2021 0818  ? BASOSABS 42 08/19/2021 0818  ? ? ?Hgb A1C ?Lab Results  ?Component Value Date  ? HGBA1C 5.4 08/19/2021  ? ? ? ? ? ? ?   ?Assessment & Plan:  ?Acute Left-Sided Low Back Pain with Left-Sided Sciatica: ? ?Encouraged stretching ?Ice and may be helpful ?Rx for Pred taper x9 days ?Rx for Methocarbamol 500 mg at bedtime-sedation caution given ?If symptoms persist, would recommend x-ray and physical therapy ? ?Return precautions discussed ? ? ?[name], NP ?This visit occurred during the SARS-CoV-2 public health emergency.  Safety protocols were in place, including screening  questions prior to the visit, additional usage of staff PPE, and extensive cleaning of exam room while observing appropriate contact time as indicated for disinfecting solutions.  ? ?

## 2022-01-26 NOTE — Patient Instructions (Signed)

## 2022-02-02 ENCOUNTER — Ambulatory Visit: Payer: Self-pay | Admitting: Family Medicine

## 2022-08-22 ENCOUNTER — Other Ambulatory Visit: Payer: 59

## 2022-08-29 ENCOUNTER — Encounter: Payer: 59 | Admitting: Family Medicine

## 2022-09-21 ENCOUNTER — Other Ambulatory Visit: Payer: Self-pay

## 2022-09-21 DIAGNOSIS — E7800 Pure hypercholesterolemia, unspecified: Secondary | ICD-10-CM

## 2022-09-21 DIAGNOSIS — Z0000 Encounter for general adult medical examination without abnormal findings: Secondary | ICD-10-CM

## 2022-09-21 DIAGNOSIS — Z125 Encounter for screening for malignant neoplasm of prostate: Secondary | ICD-10-CM

## 2022-09-21 DIAGNOSIS — E66811 Obesity, class 1: Secondary | ICD-10-CM

## 2022-09-21 DIAGNOSIS — E669 Obesity, unspecified: Secondary | ICD-10-CM

## 2022-09-22 ENCOUNTER — Other Ambulatory Visit: Payer: 59

## 2022-09-22 DIAGNOSIS — E7800 Pure hypercholesterolemia, unspecified: Secondary | ICD-10-CM | POA: Diagnosis not present

## 2022-09-22 DIAGNOSIS — Z125 Encounter for screening for malignant neoplasm of prostate: Secondary | ICD-10-CM | POA: Diagnosis not present

## 2022-09-22 DIAGNOSIS — Z0000 Encounter for general adult medical examination without abnormal findings: Secondary | ICD-10-CM | POA: Diagnosis not present

## 2022-09-22 DIAGNOSIS — E669 Obesity, unspecified: Secondary | ICD-10-CM | POA: Diagnosis not present

## 2022-09-23 LAB — CBC WITH DIFFERENTIAL/PLATELET
Absolute Monocytes: 663 cells/uL (ref 200–950)
Basophils Absolute: 52 cells/uL (ref 0–200)
Basophils Relative: 0.8 %
Eosinophils Absolute: 338 cells/uL (ref 15–500)
Eosinophils Relative: 5.2 %
HCT: 46.7 % (ref 38.5–50.0)
Hemoglobin: 16.3 g/dL (ref 13.2–17.1)
Lymphs Abs: 1996 cells/uL (ref 850–3900)
MCH: 32.7 pg (ref 27.0–33.0)
MCHC: 34.9 g/dL (ref 32.0–36.0)
MCV: 93.6 fL (ref 80.0–100.0)
MPV: 10.9 fL (ref 7.5–12.5)
Monocytes Relative: 10.2 %
Neutro Abs: 3452 cells/uL (ref 1500–7800)
Neutrophils Relative %: 53.1 %
Platelets: 243 10*3/uL (ref 140–400)
RBC: 4.99 10*6/uL (ref 4.20–5.80)
RDW: 12.4 % (ref 11.0–15.0)
Total Lymphocyte: 30.7 %
WBC: 6.5 10*3/uL (ref 3.8–10.8)

## 2022-09-23 LAB — COMPREHENSIVE METABOLIC PANEL
AG Ratio: 1.8 (calc) (ref 1.0–2.5)
ALT: 31 U/L (ref 9–46)
AST: 20 U/L (ref 10–35)
Albumin: 4.2 g/dL (ref 3.6–5.1)
Alkaline phosphatase (APISO): 65 U/L (ref 35–144)
BUN: 10 mg/dL (ref 7–25)
CO2: 29 mmol/L (ref 20–32)
Calcium: 9.2 mg/dL (ref 8.6–10.3)
Chloride: 102 mmol/L (ref 98–110)
Creat: 1.02 mg/dL (ref 0.70–1.30)
Globulin: 2.4 g/dL (calc) (ref 1.9–3.7)
Glucose, Bld: 90 mg/dL (ref 65–99)
Potassium: 4.7 mmol/L (ref 3.5–5.3)
Sodium: 138 mmol/L (ref 135–146)
Total Bilirubin: 0.5 mg/dL (ref 0.2–1.2)
Total Protein: 6.6 g/dL (ref 6.1–8.1)

## 2022-09-23 LAB — LIPID PANEL
Cholesterol: 219 mg/dL — ABNORMAL HIGH (ref <200)
HDL: 42 mg/dL (ref >=40)
LDL Cholesterol (Calc): 150 mg/dL (calc) — ABNORMAL HIGH
Non-HDL Cholesterol (Calc): 177 mg/dL (calc) — ABNORMAL HIGH (ref <130)
Total CHOL/HDL Ratio: 5.2 (calc) — ABNORMAL HIGH (ref <5.0)
Triglycerides: 138 mg/dL (ref <150)

## 2022-09-23 LAB — HEMOGLOBIN A1C
Hgb A1c MFr Bld: 5.8 % of total Hgb — ABNORMAL HIGH (ref <5.7)
Mean Plasma Glucose: 120 mg/dL
eAG (mmol/L): 6.6 mmol/L

## 2022-09-23 LAB — PSA: PSA: 0.16 ng/mL (ref <=4.00)

## 2022-09-28 ENCOUNTER — Ambulatory Visit (INDEPENDENT_AMBULATORY_CARE_PROVIDER_SITE_OTHER): Payer: 59 | Admitting: Family Medicine

## 2022-09-28 ENCOUNTER — Encounter: Payer: Self-pay | Admitting: Family Medicine

## 2022-09-28 ENCOUNTER — Other Ambulatory Visit: Payer: Self-pay | Admitting: Family Medicine

## 2022-09-28 VITALS — BP 122/76 | HR 74 | Ht 71.0 in | Wt 234.0 lb

## 2022-09-28 DIAGNOSIS — Z1211 Encounter for screening for malignant neoplasm of colon: Secondary | ICD-10-CM

## 2022-09-28 DIAGNOSIS — Z1159 Encounter for screening for other viral diseases: Secondary | ICD-10-CM

## 2022-09-28 DIAGNOSIS — M25512 Pain in left shoulder: Secondary | ICD-10-CM | POA: Diagnosis not present

## 2022-09-28 DIAGNOSIS — E7800 Pure hypercholesterolemia, unspecified: Secondary | ICD-10-CM

## 2022-09-28 DIAGNOSIS — G8929 Other chronic pain: Secondary | ICD-10-CM | POA: Diagnosis not present

## 2022-09-28 DIAGNOSIS — E669 Obesity, unspecified: Secondary | ICD-10-CM

## 2022-09-28 DIAGNOSIS — R7309 Other abnormal glucose: Secondary | ICD-10-CM | POA: Diagnosis not present

## 2022-09-28 DIAGNOSIS — Z9889 Other specified postprocedural states: Secondary | ICD-10-CM

## 2022-09-28 DIAGNOSIS — Z0000 Encounter for general adult medical examination without abnormal findings: Secondary | ICD-10-CM

## 2022-09-28 MED ORDER — MELOXICAM 15 MG PO TABS
15.0000 mg | ORAL_TABLET | Freq: Every day | ORAL | 2 refills | Status: DC | PRN
Start: 2022-09-28 — End: 2023-04-11

## 2022-09-28 MED ORDER — GABAPENTIN 100 MG PO CAPS: ORAL_CAPSULE | ORAL | 1 refills | Status: DC

## 2022-09-28 NOTE — Progress Notes (Signed)
Subjective:    Patient ID: Garrett Mcmahon, male    DOB: 01-Apr-1971, 51 y.o.   MRN: [id number]  Garrett Mcmahon is a 51 y.o. male presenting on 09/28/2022 for Annual Exam   HPI   Here for Annual Physical and Lab Review.    LIFESTYLE / WELLNESS / HYPERLIPIDEMIA / OBESITY BMI >32 - Reports no new concerns. Last lipid panel 09/2022 elevated LDL 150s, has fluctuated over years - Never on cholesterol med Lifestyle He avoid fried foods. He admits does eat more sausage biscuit snacks - Exercise: improving exercise plans  The 10-year ASCVD risk score (Arnett DK, et al., 2019) is: 5.2%   Elevated A1c Elevated A1c to 5.8, previously 5.4 range Meds: Never on med Lifestyle: - Diet (admits to inc bread )  Denies hypoglycemia, polyuria, visual changes, numbness or tingling.    PMH: - S/p Left Shoulder Surgery, rotator cuff repair 2020, he has issues episodic flares and some days limiting him significantly. He can have some constant pain and some burning or warm pain. Takes occasional Ibuprofen / Advil if need. He has had significant pain on many days. He does not lift >40 lbs, but some days if he repetitively is lifting 20 lbs most of the day it can aggravate symptoms more.  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 Currently with mild weaker stream and nocturia but mostly during day functions well. No known family history of prostate CA.    Colon CA Screening: Never had colonoscopy. Currently asymptomatic. No known family history of colon CA. Due for screening test Cologuard, counseling given ordered Cologuard today    Due for Flu Shot and COVID19 vaccine, declines today despite counseling on benefits     01/26/2022    9:54 AM 08/26/2021   10:56 AM 07/08/2021   10:49 AM  Depression screen  PHQ 2/9  Decreased Interest 0 0 0  Down, Depressed, Hopeless 0 1 0  PHQ - 2 Score 0 1 0  Altered sleeping 0 0 0  Tired, decreased energy '2 2 1  '$ Change in appetite 0 2 0  Feeling bad or failure about yourself  0 0 0  Trouble concentrating 0 0 0  Moving slowly or fidgety/restless 0 0 1  Suicidal thoughts 0 0 0  PHQ-9 Score '2 5 2  '$ Difficult doing work/chores Not difficult at all Somewhat difficult Not difficult at all    Past Medical History:  Diagnosis Date   Family history of MI (myocardial infarction) 01/18/2017   Age- 41's   H/O shoulder surgery    Inguinal hernia 1971   Repaired at 2 months old    Tobacco use    Past Surgical History:  Procedure Laterality Date   INGUINAL HERNIA REPAIR Right 1971   Repaired at 54 months old   KNEE ARTHROSCOPY Right    x2   KNEE ARTHROSCOPY W/ ACL RECONSTRUCTION Right    Social History   Socioeconomic History   Marital status: Married    Spouse name: Not on file   Number of children: Not on file   Years of education: High School   Highest education level: High school graduate  Occupational History   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  Tobacco Use   Smoking status: Never   Smokeless tobacco: Current  Types: Chew   Tobacco comments:    1 can dip per day for 26+ years  Vaping Use   Vaping Use: Never used  Substance and Sexual Activity   Alcohol use: No   Drug use: No   Sexual activity: Not on file  Other Topics Concern   Not on file  Social History Narrative   Not on file   Social Determinants of Health   Financial Resource Strain: Not on file  Food Insecurity: Not on file  Transportation Needs: Not on file  Physical Activity: Not on file  Stress: Not on file  Social Connections: Not on file  Intimate Partner Violence: Not on file   Family History  Problem Relation Age of Onset   COPD Mother    Bipolar disorder Father    Alcohol abuse Father    Heart attack Maternal Grandmother    Breast cancer Maternal  Grandmother    Heart disease Maternal Grandmother    Heart disease Paternal Grandfather    Heart attack Paternal Grandfather    Lung cancer Paternal Grandfather        Smoker   Cancer Paternal Grandfather        Lung   Heart disease Maternal Uncle    Cancer Paternal Grandmother        breast   Prostate cancer Neg Hx    Colon cancer Neg Hx    No current outpatient medications on file prior to visit.   No current facility-administered medications on file prior to visit.    Review of Systems  Constitutional:  Negative for activity change, appetite change, chills, diaphoresis, fatigue and fever.  HENT:  Negative for congestion and hearing loss.   Eyes:  Negative for visual disturbance.  Respiratory:  Negative for cough, chest tightness, shortness of breath and wheezing.   Cardiovascular:  Negative for chest pain, palpitations and leg swelling.  Gastrointestinal:  Negative for abdominal pain, constipation, diarrhea, nausea and vomiting.  Genitourinary:  Negative for dysuria, frequency and hematuria.  Musculoskeletal:  Positive for arthralgias. Negative for neck pain.  Skin:  Negative for rash.  Neurological:  Negative for dizziness, weakness, light-headedness, numbness and headaches.  Hematological:  Negative for adenopathy.  Psychiatric/Behavioral:  Negative for behavioral problems, dysphoric mood and sleep disturbance.    Per HPI unless specifically indicated above      Objective:    BP 122/76   Pulse 74   Ht '5\' 11"'$  (1.803 m)   Wt 234 lb (106.1 kg)   SpO2 99%   BMI 32.64 kg/m   Wt Readings from Last 3 Encounters:  09/28/22 234 lb (106.1 kg)  01/26/22 236 lb (107 kg)  08/26/21 228 lb (103.4 kg)    Physical Exam Vitals and nursing note reviewed.  Constitutional:      General: He is not in acute distress.    Appearance: He is well-developed. He is not diaphoretic.     Comments: Well-appearing, comfortable, cooperative  HENT:     Head: Normocephalic and atraumatic.   Eyes:     General:        Right eye: No discharge.        Left eye: No discharge.     Conjunctiva/sclera: Conjunctivae normal.     Pupils: Pupils are equal, round, and reactive to light.  Neck:     Thyroid: No thyromegaly.     Vascular: No carotid bruit.  Cardiovascular:     Rate and Rhythm: Normal rate and regular rhythm.  Pulses: Normal pulses.     Heart sounds: Normal heart sounds. No murmur heard. Pulmonary:     Effort: Pulmonary effort is normal. No respiratory distress.     Breath sounds: Normal breath sounds. No wheezing or rales.  Abdominal:     General: Bowel sounds are normal. There is no distension.     Palpations: Abdomen is soft. There is no mass.     Tenderness: There is no abdominal tenderness.  Musculoskeletal:        General: No tenderness. Normal range of motion.     Cervical back: Normal range of motion and neck supple.     Right lower leg: No edema.     Left lower leg: No edema.     Comments: Upper / Lower Extremities: - Normal muscle tone, strength bilateral upper extremities 5/5, lower extremities 5/5  Lymphadenopathy:     Cervical: No cervical adenopathy.  Skin:    General: Skin is warm and dry.     Findings: No erythema or rash.  Neurological:     Mental Status: He is alert and oriented to person, place, and time.     Comments: Distal sensation intact to light touch all extremities  Psychiatric:        Mood and Affect: Mood normal.        Behavior: Behavior normal.        Thought Content: Thought content normal.     Comments: Well groomed, good eye contact, normal speech and thoughts      Results for orders placed or performed in visit on 09/21/22  PSA  Result Value Ref Range   PSA 0.16 < OR = 4.00 ng/mL  HgB A1c  Result Value Ref Range   Hgb A1c MFr Bld 5.8 (H) <5.7 % of total Hgb   Mean Plasma Glucose 120 mg/dL   eAG (mmol/L) 6.6 mmol/L  CBC with Differential  Result Value Ref Range   WBC 6.5 3.8 - 10.8 Thousand/uL   RBC 4.99 4.20  - 5.80 Million/uL   Hemoglobin 16.3 13.2 - 17.1 g/dL   HCT 46.7 38.5 - 50.0 %   MCV 93.6 80.0 - 100.0 fL   MCH 32.7 27.0 - 33.0 pg   MCHC 34.9 32.0 - 36.0 g/dL   RDW 12.4 11.0 - 15.0 %   Platelets 243 140 - 400 Thousand/uL   MPV 10.9 7.5 - 12.5 fL   Neutro Abs 3,452 1,500 - 7,800 cells/uL   Lymphs Abs 1,996 850 - 3,900 cells/uL   Absolute Monocytes 663 200 - 950 cells/uL   Eosinophils Absolute 338 15 - 500 cells/uL   Basophils Absolute 52 0 - 200 cells/uL   Neutrophils Relative % 53.1 %   Total Lymphocyte 30.7 %   Monocytes Relative 10.2 %   Eosinophils Relative 5.2 %   Basophils Relative 0.8 %  Lipid panel  Result Value Ref Range   Cholesterol 219 (H) <200 mg/dL   HDL 42 > OR = 40 mg/dL   Triglycerides 138 <150 mg/dL   LDL Cholesterol (Calc) 150 (H) mg/dL (calc)   Total CHOL/HDL Ratio 5.2 (H) <5.0 (calc)   Non-HDL Cholesterol (Calc) 177 (H) <130 mg/dL (calc)  Comprehensive Metabolic Panel (CMET)  Result Value Ref Range   Glucose, Bld 90 65 - 99 mg/dL   BUN 10 7 - 25 mg/dL   Creat 1.02 0.70 - 1.30 mg/dL   BUN/Creatinine Ratio SEE NOTE: 6 - 22 (calc)   Sodium 138 135 - 146 mmol/L  Potassium 4.7 3.5 - 5.3 mmol/L   Chloride 102 98 - 110 mmol/L   CO2 29 20 - 32 mmol/L   Calcium 9.2 8.6 - 10.3 mg/dL   Total Protein 6.6 6.1 - 8.1 g/dL   Albumin 4.2 3.6 - 5.1 g/dL   Globulin 2.4 1.9 - 3.7 g/dL (calc)   AG Ratio 1.8 1.0 - 2.5 (calc)   Total Bilirubin 0.5 0.2 - 1.2 mg/dL   Alkaline phosphatase (APISO) 65 35 - 144 U/L   AST 20 10 - 35 U/L   ALT 31 9 - 46 U/L      Assessment & Plan:   Problem List Items Addressed This Visit     Elevated hemoglobin A1c    Newly identified mild elevated A1c vs PreDM Concern with obesity, HTN, HLD  Plan:  1. Not on any therapy currently  2. Encourage improved lifestyle - low carb, low sugar diet, reduce portion size, continue improving regular exercise  F/u 6 month      Hyperlipidemia    Elevated LDL 150s, previously better  controlled The 10-year ASCVD risk score (Arnett DK, et al., 2019) is: 5.2%   Plan: 1. Not on medication, not indicated at this time 2. Encourage improved lifestyle - low carb/cholesterol, reduce portion size, continue improving regular exercise  Repeat 6 month      Obesity (BMI 30.0-34.9)   S/P rotator cuff surgery   Other Visit Diagnoses     Annual physical exam    -  Primary   Screening for colon cancer       Relevant Orders   Cologuard   Chronic left shoulder pain       Relevant Medications   meloxicam (MOBIC) 15 MG tablet   gabapentin (NEURONTIN) 100 MG capsule   Other Relevant Orders   Ambulatory referral to Orthopedic Surgery   S/P left rotator cuff repair       Relevant Medications   meloxicam (MOBIC) 15 MG tablet   gabapentin (NEURONTIN) 100 MG capsule   Other Relevant Orders   Ambulatory referral to Orthopedic Surgery       Updated Health Maintenance information Reviewed recent lab results with patient Encouraged improvement to lifestyle with diet and exercise Goal of weight loss  #L Shoulder Chronic pain S./p rotator cuff repair Will proceed w/ med management until he can get in to Orthopedics for 2nd opinion He prefers to not return to previous surgeon Order NSAID Meloxicam '15mg'$  daily AS NEEDED and Gabapentin titration Refer to [facility]   Orders Placed This Encounter  Procedures   Cologuard   Ambulatory referral to Orthopedic Surgery    Referral Priority:   Routine    Referral Type:   Surgical    Referral Reason:   Specialty Services Required    Requested Specialty:   Orthopedic Surgery    Number of Visits Requested:   1     Meds ordered this encounter  Medications   meloxicam (MOBIC) 15 MG tablet    Sig: Take 1 tablet (15 mg total) by mouth daily as needed for pain.    Dispense:  30 tablet    Refill:  2   gabapentin (NEURONTIN) 100 MG capsule    Sig: Start 1 capsule daily, increase by 1 cap every 2-3 days as tolerated up to 3 times a  day, or may take 3 at once in evening.    Dispense:  90 capsule    Refill:  1      Follow up  plan: Return in about 6 months (around 03/29/2023) for 6 month fasting lab only then 1 week later Follow-up Lab results PreDM, HLD, Wt.  Future labs Lipid A1c TSH Hep C  Nobie Putnam, DO [facility] 09/28/2022, 4:07 PM

## 2022-09-28 NOTE — Assessment & Plan Note (Signed)
Elevated LDL 150s, previously better controlled The 10-year ASCVD risk score (Arnett DK, et al., 2019) is: 5.2%   Plan: 1. Not on medication, not indicated at this time 2. Encourage improved lifestyle - low carb/cholesterol, reduce portion size, continue improving regular exercise  Repeat 6 month

## 2022-09-28 NOTE — Patient Instructions (Addendum)
Thank you for coming to the office today.  Referral to [facility] Meloxicam 84m daily for anti inflammatory with meal (hold all other OTC anti inflammatories) Can take it for 1-2 weeks then stop for 1-2 weeks, take it intermittently. Not daily.  Tylenol is safe  Start Gabapentin 1028mcapsules, take at night for 2-3 nights only, and then increase to 2 times a day for a few days, and then may increase to 3 times a day, it may make you drowsy, if helps significantly at night only, then you can increase instead to 3 capsules at night, instead of 3 times a day - In the future if needed, we can significantly increase the dose if tolerated well, some common doses are 30020mhree times a day up to 600m31mree times a day, usually it takes several weeks or months to get to higher doses   Recent Labs    09/22/22 0000  HGBA1C 5.8*   Mild Pre Diabetes range, this is early and we can fix it. Goal is to limit carb starch sugar.  We will re-check blood in 6 months.  Cholesterol mild elevated LDL "bad cholesterol" Goal to limit red meat, sausage etc   Ordered the Cologuard (home kit) test for colon cancer screening. Stay tuned for further updates.  It will be shipped to you directly. If not received in 2-4 weeks, call us [employer].   If you send it back and no results are received in 2-4 weeks, call us [employer] company as well!   Colon Cancer Screening: - For all adults age 74+ 77+tine colon cancer screening is highly recommended.     - Recent guidelines from [employer] starting age of 45 -34arly detection of colon cancer is important, because often there are no warning signs or symptoms, also if found early usually it can be cured. Late stage is hard to treat.   - If Cologuard is NEGATIVE, then it is good for 3 years before next due - If Cologuard is POSITIVE, then it is strongly advised to get a Colonoscopy, which allows the GI doctor to locate  the source of the cancer or polyp (even very early stage) and treat it by removing it. ------------------------- Follow instructions to collect sample, you may call the company for any help or questions, 24/7 telephone support at [phone] at least 3 meals and 1-2 snacks per day (don't skip breakfast).  Aim for no more than 5 hours between eating. - Tip: If you go >5 hours without eating and become very hungry, your body will supply it's own resources temporarily and you can gain extra weight when you eat.  Diet Recommendations for Preventing Diabetes   REDUCE Starchy (carb) foods include: Bread, rice, pasta, potatoes, corn, crackers, bagels, muffins, all baked goods.   FRUITS - LIMIT these HIGH sugar/carb fruits = Pineapple, Watermelon, Bananas - OKAY with these MEDIUM sugar/carb fruits = Citrus, Oranges, Grapes - PREFER these LOW sugar/carb fruits = Apples, Berries, Pears, Plums  Protein foods include: Meat, fish, poultry, eggs, dairy foods, and beans such as pinto and kidney beans (beans also provide carbohydrate).   1. Eat at least 3 meals and 1-2 snacks per day. Never go more than 4-5 hours while awake without eating.   2. Limit starchy foods to TWO per meal and ONE per snack. ONE portion of a starchy  food is equal to the following:   - ONE slice of bread (  or its equivalent, such as half of a hamburger bun).   - 1/2 cup of a "scoopable" starchy food such as potatoes or rice.   - 1 OUNCE (28 grams) of starchy snacks (crackers or pretzels, look on label).   - 15 grams of carbohydrate as shown on food label.   3. Both lunch and dinner should include a protein food, a carb food, and vegetables.   - Obtain twice as many veg's as protein or carbohydrate foods for both lunch and dinner.   - Try to keep frozen veg's on hand for a quick vegetable serving.     - Fresh or frozen veg's are best.   4. Breakfast should always include protein.     DUE for FASTING BLOOD WORK (no  food or drink after midnight before the lab appointment, only water or coffee without cream/sugar on the morning of)  SCHEDULE "Lab Only" visit in the morning at the clinic for lab draw in 6 MONTHS   - Make sure Lab Only appointment is at about 1 week before your next appointment, so that results will be available  For Lab Results, once available within 2-3 days of blood draw, you can can log in to MyChart online to view your results and a brief explanation. Also, we can discuss results at next follow-up visit.    Please schedule a Follow-up Appointment to: Return in about 6 months (around 03/29/2023) for 6 month fasting lab only then 1 week later Follow-up Lab results PreDM, HLD, Wt.  If you have any other questions or concerns, please feel free to call the office or send a message through [place]. You may also schedule an earlier appointment if necessary.  Additionally, you may be receiving a survey about your experience at our office within a few days to 1 week by e-mail or mail. We value your feedback.  Nobie Putnam, DO [facility], [facility]  Heart-Healthy Eating Plan Many factors influence your heart health, including eating and exercise habits. Heart health is also called coronary health. Coronary risk increases with abnormal blood fat (lipid) levels. A heart-healthy eating plan includes limiting unhealthy fats, increasing healthy fats, limiting salt (sodium) intake, and making other diet and lifestyle changes. What is my plan? Your health care provider may recommend that: You limit your fat intake to _________% or less of your total calories each day. You limit your saturated fat intake to _________% or less of your total calories each day. You limit the amount of cholesterol in your diet to less than _________ mg per day. You limit the amount of sodium in your diet to less than _________ mg per day. What are tips for following this plan? Cooking Cook foods  using methods other than frying. Baking, boiling, grilling, and broiling are all good options. Other ways to reduce fat include: Removing the skin from poultry. Removing all visible fats from meats. Steaming vegetables in water or broth. Meal planning  At meals, imagine dividing your plate into fourths: Fill one-half of your plate with vegetables and green salads. Fill one-fourth of your plate with whole grains. Fill one-fourth of your plate with lean protein foods. Eat 2-4 cups of vegetables per day. One cup of vegetables equals 1 cup (91 g) broccoli or cauliflower florets, 2 medium carrots, 1 large bell pepper, 1 large sweet potato, 1 large tomato, 1 medium white potato, 2 cups (150 g) raw leafy greens. Eat 1-2 cups of fruit per day. One cup of fruit equals  1 small apple, 1 large banana, 1 cup (237 g) mixed fruit, 1 large orange,  cup (82 g) dried fruit, 1 cup (240 mL) 100% fruit juice. Eat more foods that contain soluble fiber. Examples include apples, broccoli, carrots, beans, peas, and barley. Aim to get 25-30 g of fiber per day. Increase your consumption of legumes, nuts, and seeds to 4-5 servings per week. One serving of dried beans or legumes equals  cup (90 g) cooked, 1 serving of nuts is  oz (12 almonds, 24 pistachios, or 7 walnut halves), and 1 serving of seeds equals  oz (8 g). Fats Choose healthy fats more often. Choose monounsaturated and polyunsaturated fats, such as olive and canola oils, avocado oil, flaxseeds, walnuts, almonds, and seeds. Eat more omega-3 fats. Choose salmon, mackerel, sardines, tuna, flaxseed oil, and ground flaxseeds. Aim to eat fish at least 2 times each week. Check food labels carefully to identify foods with trans fats or high amounts of saturated fat. Limit saturated fats. These are found in animal products, such as meats, butter, and cream. Plant sources of saturated fats include palm oil, palm kernel oil, and coconut oil. Avoid foods with partially  hydrogenated oils in them. These contain trans fats. Examples are stick margarine, some tub margarines, cookies, crackers, and other baked goods. Avoid fried foods. General information Eat more home-cooked food and less restaurant, buffet, and fast food. Limit or avoid alcohol. Limit foods that are high in added sugar and simple starches such as foods made using white refined flour (white breads, pastries, sweets). Lose weight if you are overweight. Losing just 5-10% of your body weight can help your overall health and prevent diseases such as diabetes and heart disease. Monitor your sodium intake, especially if you have high blood pressure. Talk with your health care provider about your sodium intake. Try to incorporate more vegetarian meals weekly. What foods should I eat? Fruits All fresh, canned (in natural juice), or frozen fruits. Vegetables Fresh or frozen vegetables (raw, steamed, roasted, or grilled). Green salads. Grains Most grains. Choose whole wheat and whole grains most of the time. Rice and pasta, including brown rice and pastas made with whole wheat. Meats and other proteins Lean, well-trimmed beef, veal, pork, and lamb. Chicken and [place] without skin. All fish and shellfish. Wild duck, rabbit, pheasant, and venison. Egg whites or low-cholesterol egg substitutes. Dried beans, peas, lentils, and tofu. Seeds and most nuts. Dairy Low-fat or nonfat cheeses, including ricotta and mozzarella. Skim or 1% milk (liquid, powdered, or evaporated). Buttermilk made with low-fat milk. Nonfat or low-fat yogurt. Fats and oils Non-hydrogenated (trans-free) margarines. Vegetable oils, including soybean, sesame, sunflower, olive, avocado, peanut, safflower, corn, canola, and cottonseed. Salad dressings or mayonnaise made with a vegetable oil. Beverages Water (mineral or sparkling). Coffee and tea. Unsweetened ice tea. Diet beverages. Sweets and desserts Sherbet, gelatin, and fruit ice. Small  amounts of dark chocolate. Limit all sweets and desserts. Seasonings and condiments All seasonings and condiments. The items listed above may not be a complete list of foods and beverages you can eat. Contact a dietitian for more options. What foods should I avoid? Fruits Canned fruit in heavy syrup. Fruit in cream or butter sauce. Fried fruit. Limit coconut. Vegetables Vegetables cooked in cheese, cream, or butter sauce. Fried vegetables. Grains Breads made with saturated or trans fats, oils, or whole milk. Croissants. Sweet rolls. Donuts. High-fat crackers, such as cheese crackers and chips. Meats and other proteins Fatty meats, such as hot dogs, ribs, sausage,  bacon, rib-eye roast or steak. High-fat deli meats, such as salami and bologna. Caviar. Domestic duck and goose. Organ meats, such as liver. Dairy Cream, sour cream, cream cheese, and creamed cottage cheese. Whole-milk cheeses. Whole or 2% milk (liquid, evaporated, or condensed). Whole buttermilk. Cream sauce or high-fat cheese sauce. Whole-milk yogurt. Fats and oils Meat fat, or shortening. Cocoa butter, hydrogenated oils, palm oil, coconut oil, palm kernel oil. Solid fats and shortenings, including bacon fat, salt pork, lard, and butter. Nondairy cream substitutes. Salad dressings with cheese or sour cream. Beverages Regular sodas and any drinks with added sugar. Sweets and desserts Frosting. Pudding. Cookies. Cakes. Pies. Milk chocolate or white chocolate. Buttered syrups. Full-fat ice cream or ice cream drinks. The items listed above may not be a complete list of foods and beverages to avoid. Contact a dietitian for more information. Summary Heart-healthy meal planning includes limiting unhealthy fats, increasing healthy fats, limiting salt (sodium) intake and making other diet and lifestyle changes. Lose weight if you are overweight. Losing just 5-10% of your body weight can help your overall health and prevent diseases such as  diabetes and heart disease. Focus on eating a balance of foods, including fruits and vegetables, low-fat or nonfat dairy, lean protein, nuts and legumes, whole grains, and heart-healthy oils and fats. This information is not intended to replace advice given to you by your health care provider. Make sure you discuss any questions you have with your health care provider. Document Revised: 11/29/2021 Document Reviewed: 11/29/2021 Elsevier Patient Education  [place].

## 2022-09-28 NOTE — Assessment & Plan Note (Signed)
Newly identified mild elevated A1c vs PreDM Concern with obesity, HTN, HLD  Plan:  1. Not on any therapy currently  2. Encourage improved lifestyle - low carb, low sugar diet, reduce portion size, continue improving regular exercise  F/u 6 month

## 2022-10-22 ENCOUNTER — Emergency Department: Payer: 59

## 2022-10-22 ENCOUNTER — Other Ambulatory Visit: Payer: Self-pay

## 2022-10-22 ENCOUNTER — Emergency Department
Admission: EM | Admit: 2022-10-22 | Discharge: 2022-10-23 | Disposition: A | Discharge status: Home / Self Care | Payer: 59 | Attending: Emergency Medicine | Admitting: Emergency Medicine

## 2022-10-22 DIAGNOSIS — R509 Fever, unspecified: Secondary | ICD-10-CM | POA: Diagnosis not present

## 2022-10-22 DIAGNOSIS — J189 Pneumonia, unspecified organism: Secondary | ICD-10-CM | POA: Diagnosis not present

## 2022-10-22 DIAGNOSIS — E860 Dehydration: Secondary | ICD-10-CM | POA: Diagnosis not present

## 2022-10-22 DIAGNOSIS — Z1152 Encounter for screening for COVID-19: Secondary | ICD-10-CM | POA: Insufficient documentation

## 2022-10-22 DIAGNOSIS — J181 Lobar pneumonia, unspecified organism: Secondary | ICD-10-CM | POA: Insufficient documentation

## 2022-10-22 LAB — COMPREHENSIVE METABOLIC PANEL
ALT: 60 U/L — ABNORMAL HIGH (ref 0–44)
AST: 46 U/L — ABNORMAL HIGH (ref 15–41)
Albumin: 3.6 g/dL (ref 3.5–5.0)
Alkaline Phosphatase: 52 U/L (ref 38–126)
Anion gap: 9 (ref 5–15)
BUN: 7 mg/dL (ref 6–20)
CO2: 24 mmol/L (ref 22–32)
Calcium: 8.5 mg/dL — ABNORMAL LOW (ref 8.9–10.3)
Chloride: 103 mmol/L (ref 98–111)
Creatinine, Ser: 0.99 mg/dL (ref 0.61–1.24)
GFR, Estimated: >60 mL/min (ref >60)
Glucose, Bld: 122 mg/dL — ABNORMAL HIGH (ref 70–99)
Potassium: 3.5 mmol/L (ref 3.5–5.1)
Sodium: 136 mmol/L (ref 135–145)
Total Bilirubin: 1.3 mg/dL — ABNORMAL HIGH (ref 0.3–1.2)
Total Protein: 7 g/dL (ref 6.5–8.1)

## 2022-10-22 LAB — CBC WITH DIFFERENTIAL/PLATELET
Abs Immature Granulocytes: 0.03 10*3/uL (ref 0.00–0.07)
Basophils Absolute: 0 10*3/uL (ref 0.0–0.1)
Basophils Relative: 0 %
Eosinophils Absolute: 0 10*3/uL (ref 0.0–0.5)
Eosinophils Relative: 0 %
HCT: 45.4 % (ref 39.0–52.0)
Hemoglobin: 15.2 g/dL (ref 13.0–17.0)
Immature Granulocytes: 0 %
Lymphocytes Relative: 17 %
Lymphs Abs: 2 10*3/uL (ref 0.7–4.0)
MCH: 32 pg (ref 26.0–34.0)
MCHC: 33.5 g/dL (ref 30.0–36.0)
MCV: 95.6 fL (ref 80.0–100.0)
Monocytes Absolute: 1 10*3/uL (ref 0.1–1.0)
Monocytes Relative: 9 %
Neutro Abs: 8.4 10*3/uL — ABNORMAL HIGH (ref 1.7–7.7)
Neutrophils Relative %: 74 %
Platelets: 201 10*3/uL (ref 150–400)
RBC: 4.75 MIL/uL (ref 4.22–5.81)
RDW: 12.4 % (ref 11.5–15.5)
WBC: 11.4 10*3/uL — ABNORMAL HIGH (ref 4.0–10.5)
nRBC: 0 % (ref 0.0–0.2)

## 2022-10-22 LAB — URINALYSIS, ROUTINE W REFLEX MICROSCOPIC
Bilirubin Urine: NEGATIVE
Glucose, UA: NEGATIVE mg/dL
Hgb urine dipstick: NEGATIVE
Ketones, ur: NEGATIVE mg/dL
Leukocytes,Ua: NEGATIVE
Nitrite: NEGATIVE
Protein, ur: NEGATIVE mg/dL
Specific Gravity, Urine: 1.017 (ref 1.005–1.030)
pH: 6 (ref 5.0–8.0)

## 2022-10-22 LAB — LACTIC ACID, PLASMA: Lactic Acid, Venous: 0.9 mmol/L (ref 0.5–1.9)

## 2022-10-22 LAB — GROUP A STREP BY PCR: Group A Strep by PCR: NOT DETECTED

## 2022-10-22 LAB — RESP PANEL BY RT-PCR (RSV, FLU A&B, COVID)  RVPGX2
Influenza A by PCR: NEGATIVE
Influenza B by PCR: NEGATIVE
Resp Syncytial Virus by PCR: NEGATIVE
SARS Coronavirus 2 by RT PCR: NEGATIVE

## 2022-10-22 MED ORDER — SODIUM CHLORIDE 0.9 % IV BOLUS
1000.0000 mL | Freq: Once | INTRAVENOUS | Status: AC
  Administered 2022-10-22: 1000 mL via INTRAVENOUS

## 2022-10-22 MED ORDER — IOHEXOL 300 MG/ML  SOLN
75.0000 mL | Freq: Once | INTRAMUSCULAR | Status: AC | PRN
  Administered 2022-10-23: 75 mL via INTRAVENOUS

## 2022-10-22 NOTE — ED Triage Notes (Signed)
Pt has fever, productive cough, congestion, body aches, fatigue x8 days. Pt is AOX4, no cough noted at this time. Respirations even and unlabored. PT denies CP, dizziness, N/V/D.

## 2022-10-22 NOTE — ED Provider Notes (Signed)
[facility] Provider Note    Event Date/Time   First MD Initiated Contact with Patient 10/22/22 2137     (approximate)   History   Fever   HPI  Garrett Mcmahon is a 51 y.o. male with no significant past medical history other than elevated A1c presents emergency department with complaints of a fever for over 8 days.  Patient states his son was positive for the flu but feels better.  States he continues to feel weak and is unable to eat and drink.  States the cough is worsening and he feels like maybe he has pneumonia.  No vomiting or diarrhea.  Patient states he is also lost 10 to 30 pounds.  Unsure of how much but going by his scale at home it was 30.      Physical Exam   Triage Vital Signs: ED Triage Vitals  Enc Vitals Group     BP 10/22/22 2000 (!) 120/90     Pulse Rate 10/22/22 2000 100     Resp 10/22/22 2000 18     Temp 10/22/22 2000 (!) 100.6 F (38.1 C)     Temp Source 10/22/22 2000 Oral     SpO2 10/22/22 2000 94 %     Weight --      Height --      Head Circumference --      Peak Flow --      Pain Score 10/22/22 2001 2     Pain Loc --      Pain Edu? --      Excl. in [place]? --     Most recent vital signs: Vitals:   10/22/22 2000  BP: (!) 120/90  Pulse: 100  Resp: 18  Temp: (!) 100.6 F (38.1 C)  SpO2: 94%     General: Awake, no distress.   CV:  Good peripheral perfusion. regular rate and  rhythm Resp:  Normal effort. Lungs CTA, cough is deep and wet Abd:  No distention.  Nontender Other:      ED Results / Procedures / Treatments   Labs (all labs ordered are listed, but only abnormal results are displayed) Labs Reviewed  RESP PANEL BY RT-PCR (RSV, FLU A&B, COVID)  [id number]  GROUP A STREP BY PCR  CBC WITH DIFFERENTIAL/PLATELET  COMPREHENSIVE METABOLIC PANEL  LACTIC ACID, PLASMA  LACTIC ACID, PLASMA  URINALYSIS, ROUTINE W REFLEX MICROSCOPIC     EKG     RADIOLOGY Chest  x-ray    PROCEDURES:   Procedures   MEDICATIONS ORDERED IN ED: Medications  sodium chloride 0.9 % bolus 1,000 mL (has no administration in time range)     IMPRESSION / MDM / ASSESSMENT AND PLAN / ED COURSE  I reviewed the triage vital signs and the nursing notes.                              Differential diagnosis includes, but is not limited to, sepsis, CAP, influenza, COVID, RSV, strep throat, UTI  Patient's presentation is most consistent with acute complicated illness / injury requiring diagnostic workup.   Since patient is been sick for 8 days with continued fever and weakness we will do lab work  Initial labs from triage included strep test and respiratory panel which were both reassuring  Chest x-ray independently reviewed and interpreted by me as being negative for any acute abnormality      FINAL CLINICAL IMPRESSION(S) /  ED DIAGNOSES   Final diagnoses:  None     Rx / DC Orders   ED Discharge Orders     None        Note:  This document was prepared using Dragon voice recognition software and may include unintentional dictation errors.

## 2022-10-23 MED ORDER — AMOXICILLIN-POT CLAVULANATE 875-125 MG PO TABS: 1.0000 | ORAL_TABLET | Freq: Two times a day (BID) | ORAL | 0 refills | Status: DC

## 2022-10-23 MED ORDER — SODIUM CHLORIDE 0.9 % IV SOLN
1.0000 g | Freq: Once | INTRAVENOUS | Status: AC
  Administered 2022-10-23: 1 g via INTRAVENOUS
  Filled 2022-10-23: qty 10

## 2022-10-23 MED ORDER — BENZONATATE 100 MG PO CAPS: 100.0000 mg | ORAL_CAPSULE | Freq: Three times a day (TID) | ORAL | 0 refills | Status: DC | PRN

## 2022-10-23 NOTE — Discharge Instructions (Signed)
Follow-up with your regular doctor if not improving in 2 days.  Return emergency department worsening.  Take the antibiotics as prescribed.  Drink plenty of fluids.  Take Mucinex to help thin the mucus to get it out.

## 2023-02-28 ENCOUNTER — Encounter: Payer: Self-pay | Admitting: Family Medicine

## 2023-02-28 ENCOUNTER — Ambulatory Visit: Payer: 59 | Admitting: Family Medicine

## 2023-02-28 VITALS — BP 106/74 | HR 80 | Ht 71.0 in | Wt 229.2 lb

## 2023-02-28 DIAGNOSIS — L820 Inflamed seborrheic keratosis: Secondary | ICD-10-CM

## 2023-02-28 NOTE — Progress Notes (Signed)
Subjective:    Patient ID: Garrett Mcmahon, male    DOB: [birth year], 52 y.o.   MRN: [id number]  Garrett Mcmahon is a 52 y.o. male presenting on 02/28/2023 for Acute Visit (Small spot on left collar bone area. Patient states it started itching around 2 weeks ago. It doesn't hurt and hasn't oozed that he is aware of.)   HPI  Seborrheic Keratosis Skin Spot, Left Collar bone and Left flank similar spot, growth more recently with raised slightly brown skin growth. Reports itchy irritated and inflamed. Trying to avoid scratching. No other rash. Denies fever, drainage pus bleeding      02/28/2023    4:17 PM 01/26/2022    9:54 AM 08/26/2021   10:56 AM  Depression screen PHQ 2/9  Decreased Interest 0 0 0  Down, Depressed, Hopeless 0 0 1  PHQ - 2 Score 0 0 1  Altered sleeping 0 0 0  Tired, decreased energy 0 2 2  Change in appetite 0 0 2  Feeling bad or failure about yourself  0 0 0  Trouble concentrating 0 0 0  Moving slowly or fidgety/restless 0 0 0  Suicidal thoughts 0 0 0  PHQ-9 Score 0 2 5  Difficult doing work/chores Not difficult at all Not difficult at all Somewhat difficult    Social History   Tobacco Use   Smoking status: Never   Smokeless tobacco: Current    Types: Chew   Tobacco comments:    1 can dip per day for 26+ years  Vaping Use   Vaping Use: Never used  Substance Use Topics   Alcohol use: No   Drug use: No    Review of Systems Per HPI unless specifically indicated above     Objective:    BP 106/74   Pulse 80   Ht 5\' 11"  (1.803 m)   Wt 229 lb 3.2 oz (104 kg)   SpO2 97%   BMI 31.97 kg/m   Wt Readings from Last 3 Encounters:  02/28/23 229 lb 3.2 oz (104 kg)  09/28/22 234 lb (106.1 kg)  01/26/22 236 lb (107 kg)    Physical Exam Vitals and nursing note reviewed.  Constitutional:      General: He is not in acute distress.    Appearance: Normal appearance. He is well-developed. He is not diaphoretic.     Comments: Well-appearing, comfortable,  cooperative  HENT:     Head: Normocephalic and atraumatic.  Eyes:     General:        Right eye: No discharge.        Left eye: No discharge.     Conjunctiva/sclera: Conjunctivae normal.  Cardiovascular:     Rate and Rhythm: Normal rate.  Pulmonary:     Effort: Pulmonary effort is normal.  Skin:    General: Skin is warm and dry.     Findings: Lesion (< 1 cm lesion, L clavicular area, see photo attached. also larger 2 cm irregular raised SK lesion L flank not inflamed.) present. No erythema or rash.  Neurological:     Mental Status: He is alert and oriented to person, place, and time.  Psychiatric:        Mood and Affect: Mood normal.        Behavior: Behavior normal.        Thought Content: Thought content normal.     Comments: Well groomed, good eye contact, normal speech and thoughts     Left clavicular area SK  Results for orders placed or performed during the hospital encounter of 10/22/22  Resp panel by RT-PCR (RSV, Flu A&B, Covid) Anterior Nasal Swab   Specimen: Anterior Nasal Swab  Result Value Ref Range   SARS Coronavirus 2 by RT PCR NEGATIVE NEGATIVE   Influenza A by PCR NEGATIVE NEGATIVE   Influenza B by PCR NEGATIVE NEGATIVE   Resp Syncytial Virus by PCR NEGATIVE NEGATIVE  Group A Strep by PCR   Specimen: Anterior Nasal Swab; Sterile Swab  Result Value Ref Range   Group A Strep by PCR NOT DETECTED NOT DETECTED  CBC with Differential  Result Value Ref Range   WBC 11.4 (H) 4.0 - 10.5 K/uL   RBC 4.75 4.22 - 5.81 MIL/uL   Hemoglobin 15.2 13.0 - 17.0 g/dL   HCT 40.9 81.1 - 91.4 %   MCV 95.6 80.0 - 100.0 fL   MCH 32.0 26.0 - 34.0 pg   MCHC 33.5 30.0 - 36.0 g/dL   RDW 78.2 95.6 - 21.3 %   Platelets 201 150 - 400 K/uL   nRBC 0.0 0.0 - 0.2 %   Neutrophils Relative % 74 %   Neutro Abs 8.4 (H) 1.7 - 7.7 K/uL   Lymphocytes Relative 17 %   Lymphs Abs 2.0 0.7 - 4.0 K/uL   Monocytes Relative 9 %   Monocytes Absolute 1.0 0.1 - 1.0 K/uL   Eosinophils Relative 0 %    Eosinophils Absolute 0.0 0.0 - 0.5 K/uL   Basophils Relative 0 %   Basophils Absolute 0.0 0.0 - 0.1 K/uL   Immature Granulocytes 0 %   Abs Immature Granulocytes 0.03 0.00 - 0.07 K/uL  Comprehensive metabolic panel  Result Value Ref Range   Sodium 136 135 - 145 mmol/L   Potassium 3.5 3.5 - 5.1 mmol/L   Chloride 103 98 - 111 mmol/L   CO2 24 22 - 32 mmol/L   Glucose, Bld 122 (H) 70 - 99 mg/dL   BUN 7 6 - 20 mg/dL   Creatinine, Ser 0.86 0.61 - 1.24 mg/dL   Calcium 8.5 (L) 8.9 - 10.3 mg/dL   Total Protein 7.0 6.5 - 8.1 g/dL   Albumin 3.6 3.5 - 5.0 g/dL   AST 46 (H) 15 - 41 U/L   ALT 60 (H) 0 - 44 U/L   Alkaline Phosphatase 52 38 - 126 U/L   Total Bilirubin 1.3 (H) 0.3 - 1.2 mg/dL   GFR, Estimated >57 >84 mL/min   Anion gap 9 5 - 15  Lactic acid, plasma  Result Value Ref Range   Lactic Acid, Venous 0.9 0.5 - 1.9 mmol/L  Urinalysis, Routine w reflex microscopic Urine, Clean Catch  Result Value Ref Range   Color, Urine YELLOW (A) YELLOW   APPearance CLEAR (A) CLEAR   Specific Gravity, Urine 1.017 1.005 - 1.030   pH 6.0 5.0 - 8.0   Glucose, UA NEGATIVE NEGATIVE mg/dL   Hgb urine dipstick NEGATIVE NEGATIVE   Bilirubin Urine NEGATIVE NEGATIVE   Ketones, ur NEGATIVE NEGATIVE mg/dL   Protein, ur NEGATIVE NEGATIVE mg/dL   Nitrite NEGATIVE NEGATIVE   Leukocytes,Ua NEGATIVE NEGATIVE      Assessment & Plan:   Problem List Items Addressed This Visit   None Visit Diagnoses     Inflamed seborrheic keratosis    -  Primary       Consistent with seborrheic keratosis inflamed L clavicular region, see photo Recent flare up worsening, itching, irritation He has other SKs on flank and back.  Larger spot L flank no inflammation No evidence of concerning pigmented skin lesion These spots appear benign, but he would benefit from total body surface eval in future  Referral to Dermatology for further management, may warrant Cryotherapy and consider TBSE surveillance skin cancer.  Orders  Placed This Encounter  Procedures   Ambulatory referral to Dermatology    Referral Priority:   Routine    Referral Type:   Consultation    Referral Reason:   Specialty Services Required    Requested Specialty:   Dermatology    Number of Visits Requested:   1     No orders of the defined types were placed in this encounter.    Follow up plan: Return if symptoms worsen or fail to improve.   Saralyn Pilar, DO [facility] 02/28/2023, 4:24 PM

## 2023-02-28 NOTE — Patient Instructions (Addendum)
Thank you for coming to the office today.  Seborrheic keratosis (seb-o-REE-ik care-uh-TOE-sis) is a common skin growth. It may seem worrisome because it can look like a wart, pre-cancerous skin growth (actinic keratosis), or skin cancer. Despite their appearance, seborrheic keratoses are harmless. Most people get these growths when they are middle aged or older. Because they begin at a later age and can have a wart-like appearance, seborrheic keratoses are often called the "barnacles of aging." It's possible to have just one of these growths, but most people develop several. Some growths may have a warty surface while others look like dabs of warm, brown candle wax on the skin. Seborrheic keratoses range in color from white to black; however, most are tan or brown. You can find these harmless growths anywhere on the skin, except the palms and soles. Most often, you'll see them on the chest, back, head, or neck.  If it is irritated itching red - you can apply some topical cortisone cream or ointment to help alleviate some symptoms.  It does not appear infected.   [facility]  [facility]   [address] [place], [place] [address] Hours: 8AM-5PM Phone: [phone]   Please schedule a Follow-up Appointment to: Return if symptoms worsen or fail to improve.  If you have any other questions or concerns, please feel free to call the office or send a message through MyChart. You may also schedule an earlier appointment if necessary.  Additionally, you may be receiving a survey about your experience at our office within a few days to 1 week by e-mail or mail. We value your feedback.  Saralyn Pilar, DO [facility] [place], [place]

## 2023-03-29 ENCOUNTER — Other Ambulatory Visit: Payer: Medicaid Other

## 2023-03-29 DIAGNOSIS — E7800 Pure hypercholesterolemia, unspecified: Secondary | ICD-10-CM

## 2023-03-29 DIAGNOSIS — Z1159 Encounter for screening for other viral diseases: Secondary | ICD-10-CM

## 2023-03-29 DIAGNOSIS — R7309 Other abnormal glucose: Secondary | ICD-10-CM

## 2023-03-31 ENCOUNTER — Other Ambulatory Visit: Payer: 59

## 2023-04-01 LAB — HEMOGLOBIN A1C
Hgb A1c MFr Bld: 5.7 % of total Hgb — ABNORMAL HIGH (ref <5.7)
Mean Plasma Glucose: 117 mg/dL
eAG (mmol/L): 6.5 mmol/L

## 2023-04-01 LAB — LIPID PANEL
Cholesterol: 209 mg/dL — ABNORMAL HIGH (ref <200)
HDL: 51 mg/dL (ref >=40)
LDL Cholesterol (Calc): 135 mg/dL (calc) — ABNORMAL HIGH
Non-HDL Cholesterol (Calc): 158 mg/dL (calc) — ABNORMAL HIGH (ref <130)
Total CHOL/HDL Ratio: 4.1 (calc) (ref <5.0)
Triglycerides: 115 mg/dL (ref <150)

## 2023-04-01 LAB — HEPATITIS C ANTIBODY: Hepatitis C Ab: NONREACTIVE

## 2023-04-01 LAB — TSH: TSH: 0.83 mIU/L (ref 0.40–4.50)

## 2023-04-05 ENCOUNTER — Ambulatory Visit: Payer: 59 | Admitting: Family Medicine

## 2023-04-11 ENCOUNTER — Encounter: Payer: Self-pay | Admitting: Family Medicine

## 2023-04-11 ENCOUNTER — Other Ambulatory Visit: Payer: Self-pay | Admitting: Family Medicine

## 2023-04-11 ENCOUNTER — Ambulatory Visit (INDEPENDENT_AMBULATORY_CARE_PROVIDER_SITE_OTHER): Payer: 59 | Admitting: Family Medicine

## 2023-04-11 VITALS — BP 118/82 | HR 65 | Temp 98.2°F | Ht 71.0 in | Wt 226.8 lb

## 2023-04-11 DIAGNOSIS — E669 Obesity, unspecified: Secondary | ICD-10-CM | POA: Diagnosis not present

## 2023-04-11 DIAGNOSIS — E7800 Pure hypercholesterolemia, unspecified: Secondary | ICD-10-CM

## 2023-04-11 DIAGNOSIS — Z0000 Encounter for general adult medical examination without abnormal findings: Secondary | ICD-10-CM

## 2023-04-11 DIAGNOSIS — Z125 Encounter for screening for malignant neoplasm of prostate: Secondary | ICD-10-CM

## 2023-04-11 DIAGNOSIS — R7309 Other abnormal glucose: Secondary | ICD-10-CM | POA: Diagnosis not present

## 2023-04-11 NOTE — Progress Notes (Signed)
Subjective:    Patient ID: Garrett Mcmahon, male    DOB: 1971/09/07, 52 y.o.   MRN: [id number]  Garrett Mcmahon is a 52 y.o. male presenting on 04/11/2023 for Hyperglycemia (Pre-diabetes, follow up on labwork )   HPI   OBESITY BMI >31 HYPERLIPIDEMIA Last visit 09/2022 for annual physical He has done well with recent labs 03/2023 Significant lifestyle improved, wt down from 234 down to 226 He has limited carbs and cholesterol and sweets in diet Lipid shows LDL 150 to 135, prior 114 to 150 Not on Statin medication, currently  He did have CT 10/2022, showed normal heart and no other high risk cardiovascular concern. It did show some coronary artery calcification  Elevated A1c Previous result A1c 5.8% (09/2023) Now recent lab improved to A1c 5.7% (03/2023) See above lifestyle Denies hypoglycemia, polyuria, visual changes, numbness or tingling.        04/11/2023    8:35 AM 02/28/2023    4:17 PM 01/26/2022    9:54 AM  Depression screen PHQ 2/9  Decreased Interest 0 0 0  Down, Depressed, Hopeless 0 0 0  PHQ - 2 Score 0 0 0  Altered sleeping 0 0 0  Tired, decreased energy 0 0 2  Change in appetite 0 0 0  Feeling bad or failure about yourself  0 0 0  Trouble concentrating 0 0 0  Moving slowly or fidgety/restless 0 0 0  Suicidal thoughts 0 0 0  PHQ-9 Score 0 0 2  Difficult doing work/chores Not difficult at all Not difficult at all Not difficult at all    Social History   Tobacco Use   Smoking status: Never   Smokeless tobacco: Current    Types: Chew   Tobacco comments:    1 can dip per day for 26+ years  Vaping Use   Vaping Use: Never used  Substance Use Topics   Alcohol use: No   Drug use: No    Review of Systems Per HPI unless specifically indicated above     Objective:    BP 118/82 (BP Location: Right Arm, Patient Position: Sitting, Cuff Size: Normal)   Pulse 65   Temp 98.2 F (36.8 C) (Oral)   Ht 5\' 11"  (1.803 m)   Wt 226 lb 12.8 oz (102.9 kg)   SpO2 99%    BMI 31.63 kg/m   Wt Readings from Last 3 Encounters:  04/11/23 226 lb 12.8 oz (102.9 kg)  02/28/23 229 lb 3.2 oz (104 kg)  09/28/22 234 lb (106.1 kg)    Physical Exam Vitals and nursing note reviewed.  Constitutional:      General: He is not in acute distress.    Appearance: Normal appearance. He is well-developed. He is not diaphoretic.     Comments: Well-appearing, comfortable, cooperative  HENT:     Head: Normocephalic and atraumatic.  Eyes:     General:        Right eye: No discharge.        Left eye: No discharge.     Conjunctiva/sclera: Conjunctivae normal.  Cardiovascular:     Rate and Rhythm: Normal rate.  Pulmonary:     Effort: Pulmonary effort is normal.  Skin:    General: Skin is warm and dry.     Findings: No erythema or rash.  Neurological:     Mental Status: He is alert and oriented to person, place, and time.  Psychiatric:        Mood and Affect: Mood  normal.        Behavior: Behavior normal.        Thought Content: Thought content normal.     Comments: Well groomed, good eye contact, normal speech and thoughts     Recent Labs    09/22/22 0000 03/31/23 0909  HGBA1C 5.8* 5.7*     I have personally reviewed the radiology report from CT 10/22/22.  Narrative & Impression  CLINICAL DATA:  Fever and productive cough   EXAM: CT CHEST WITH CONTRAST   TECHNIQUE: Multidetector CT imaging of the chest was performed during intravenous contrast administration.   RADIATION DOSE REDUCTION: This exam was performed according to the departmental dose-optimization program which includes automated exposure control, adjustment of the mA and/or kV according to patient size and/or use of iterative reconstruction technique.   CONTRAST:  75mL OMNIPAQUE IOHEXOL 300 MG/ML  SOLN   COMPARISON:  Radiographs 10/22/2022   FINDINGS: Cardiovascular: No significant vascular findings. Normal heart size. No pericardial effusion. Coronary artery calcification.    Mediastinum/Nodes: No enlarged mediastinal, hilar, or axillary lymph nodes. Thyroid gland, trachea, and esophagus demonstrate no significant findings.   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 No pleural effusion or pneumothorax.   Upper Abdomen: No acute abnormality.   Musculoskeletal: No chest wall abnormality. No acute or significant osseous findings.   IMPRESSION: Small airway infection/inflammation greatest in the left lower lobe. Distribution is suspicious for aspiration.     Electronically Signed   By: Minerva Fester M.D.   On: 10/23/2022 00:14    Results for orders placed or performed in visit on 03/29/23  TSH  Result Value Ref Range   TSH 0.83 0.40 - 4.50 mIU/L  Hepatitis C antibody  Result Value Ref Range   Hepatitis C Ab NON-REACTIVE NON-REACTIVE  Hemoglobin A1c  Result Value Ref Range   Hgb A1c MFr Bld 5.7 (H) <5.7 % of total Hgb   Mean Plasma Glucose 117 mg/dL   eAG (mmol/L) 6.5 mmol/L  Lipid panel  Result Value Ref Range   Cholesterol 209 (H) <200 mg/dL   HDL 51 > OR = 40 mg/dL   Triglycerides 478 <295 mg/dL   LDL Cholesterol (Calc) 135 (H) mg/dL (calc)   Total CHOL/HDL Ratio 4.1 <5.0 (calc)   Non-HDL Cholesterol (Calc) 158 (H) <130 mg/dL (calc)      Assessment & Plan:   Problem List Items Addressed This Visit     Elevated hemoglobin A1c - Primary    A1c improved from 5.8 to 5.7 Concern with obesity, HTN, HLD  Plan:  1. Not on any therapy currently  2. Encourage improved lifestyle - low carb, low sugar diet, reduce portion size, continue improving regular exercise  F/u 6 month      Hyperlipidemia    Improved LDL to 130s, from 150s Not on statin The 10-year ASCVD risk score (Arnett DK, et al., 2019) is: 4.2%   Plan: 1. Not on medication, not indicated at this time 2. Encourage  improved lifestyle - low carb/cholesterol, reduce portion size, continue improving regular exercise  Repeat 6 month      Obesity (BMI 30.0-34.9)    No orders of the defined types were placed in this encounter.     Follow up plan: Return in about 5 months (around 09/11/2023) for 5-6 month fasting lab then follow-up Annual Physical .  Future labs ordered for 09/2023  Lyn Hollingshead  Althea Charon, DO [facility] 04/11/2023, 8:38 AM

## 2023-04-11 NOTE — Assessment & Plan Note (Signed)
A1c improved from 5.8 to 5.7 Concern with obesity, HTN, HLD  Plan:  1. Not on any therapy currently  2. Encourage improved lifestyle - low carb, low sugar diet, reduce portion size, continue improving regular exercise  F/u 6 month

## 2023-04-11 NOTE — Assessment & Plan Note (Signed)
Improved LDL to 130s, from 150s Not on statin The 10-year ASCVD risk score (Arnett DK, et al., 2019) is: 4.2%   Plan: 1. Not on medication, not indicated at this time 2. Encourage improved lifestyle - low carb/cholesterol, reduce portion size, continue improving regular exercise  Repeat 6 month

## 2023-04-11 NOTE — Patient Instructions (Addendum)
Thank you for coming to the office today.  Keep up the great work with lifestyle Weight has improved significantly Improved diet is working for cholesterol and sugar.  Recent Labs    09/22/22 0000 03/31/23 0909  HGBA1C 5.8* 5.7*   1. Hemoglobin A1c (Diabetes screening) - 5.7 improved from prior 5.8, but still mild elevated in range of Pre-Diabetes (>5.7 to 6.4)   2. Routine screening Hepatitis C -  Negative.   3. TSH Thyroid Function Tests - Normal.   4. Cholesterol - significant improved cholesterol from 150 down to 135. Good work.  DUE for FASTING BLOOD WORK (no food or drink after midnight before the lab appointment, only water or coffee without cream/sugar on the morning of)  SCHEDULE "Lab Only" visit in the morning at the clinic for lab draw in 5 MONTHS   - Make sure Lab Only appointment is at about 1 week before your next appointment, so that results will be available  For Lab Results, once available within 2-3 days of blood draw, you can can log in to MyChart online to view your results and a brief explanation. Also, we can discuss results at next follow-up visit.   Please schedule a Follow-up Appointment to: Return in about 5 months (around 09/11/2023) for 5-6 month fasting lab then follow-up Annual Physical .  If you have any other questions or concerns, please feel free to call the office or send a message through MyChart. You may also schedule an earlier appointment if necessary.  Additionally, you may be receiving a survey about your experience at our office within a few days to 1 week by e-mail or mail. We value your feedback.  Saralyn Pilar, DO [facility], [place]

## 2023-08-11 ENCOUNTER — Other Ambulatory Visit: Payer: Self-pay | Admitting: Family Medicine

## 2023-08-11 DIAGNOSIS — Z1211 Encounter for screening for malignant neoplasm of colon: Secondary | ICD-10-CM

## 2023-08-11 DIAGNOSIS — Z1212 Encounter for screening for malignant neoplasm of rectum: Secondary | ICD-10-CM

## 2023-09-09 LAB — COLOGUARD: COLOGUARD: NEGATIVE

## 2023-09-11 ENCOUNTER — Other Ambulatory Visit: Payer: 59

## 2023-09-11 DIAGNOSIS — R7309 Other abnormal glucose: Secondary | ICD-10-CM

## 2023-09-11 DIAGNOSIS — Z125 Encounter for screening for malignant neoplasm of prostate: Secondary | ICD-10-CM

## 2023-09-11 DIAGNOSIS — E66811 Obesity, class 1: Secondary | ICD-10-CM

## 2023-09-11 DIAGNOSIS — Z0000 Encounter for general adult medical examination without abnormal findings: Secondary | ICD-10-CM

## 2023-09-11 DIAGNOSIS — E7800 Pure hypercholesterolemia, unspecified: Secondary | ICD-10-CM

## 2023-09-12 LAB — COMPLETE METABOLIC PANEL WITH GFR
AG Ratio: 1.5 (calc) (ref 1.0–2.5)
ALT: 21 U/L (ref 9–46)
AST: 17 U/L (ref 10–35)
Albumin: 4 g/dL (ref 3.6–5.1)
Alkaline phosphatase (APISO): 62 U/L (ref 35–144)
BUN: 9 mg/dL (ref 7–25)
CO2: 29 mmol/L (ref 20–32)
Calcium: 9.1 mg/dL (ref 8.6–10.3)
Chloride: 105 mmol/L (ref 98–110)
Creat: 1.08 mg/dL (ref 0.70–1.30)
Globulin: 2.6 g/dL (ref 1.9–3.7)
Glucose, Bld: 91 mg/dL (ref 65–99)
Potassium: 4.6 mmol/L (ref 3.5–5.3)
Sodium: 140 mmol/L (ref 135–146)
Total Bilirubin: 0.4 mg/dL (ref 0.2–1.2)
Total Protein: 6.6 g/dL (ref 6.1–8.1)
eGFR: 82 mL/min/{1.73_m2} (ref >=60)

## 2023-09-12 LAB — CBC WITH DIFFERENTIAL/PLATELET
Absolute Lymphocytes: 2142 {cells}/uL (ref 850–3900)
Absolute Monocytes: 743 {cells}/uL (ref 200–950)
Basophils Absolute: 50 {cells}/uL (ref 0–200)
Basophils Relative: 0.8 %
Eosinophils Absolute: 372 {cells}/uL (ref 15–500)
Eosinophils Relative: 5.9 %
HCT: 47.8 % (ref 38.5–50.0)
Hemoglobin: 16.2 g/dL (ref 13.2–17.1)
MCH: 32.3 pg (ref 27.0–33.0)
MCHC: 33.9 g/dL (ref 32.0–36.0)
MCV: 95.4 fL (ref 80.0–100.0)
MPV: 10.9 fL (ref 7.5–12.5)
Monocytes Relative: 11.8 %
Neutro Abs: 2993 {cells}/uL (ref 1500–7800)
Neutrophils Relative %: 47.5 %
Platelets: 265 10*3/uL (ref 140–400)
RBC: 5.01 10*6/uL (ref 4.20–5.80)
RDW: 12.4 % (ref 11.0–15.0)
Total Lymphocyte: 34 %
WBC: 6.3 10*3/uL (ref 3.8–10.8)

## 2023-09-12 LAB — HEMOGLOBIN A1C
Hgb A1c MFr Bld: 5.8 %{Hb} — ABNORMAL HIGH (ref <5.7)
Mean Plasma Glucose: 120 mg/dL
eAG (mmol/L): 6.6 mmol/L

## 2023-09-12 LAB — PSA: PSA: 0.15 ng/mL (ref <=4.00)

## 2023-09-12 LAB — LIPID PANEL
Cholesterol: 200 mg/dL — ABNORMAL HIGH (ref <200)
HDL: 47 mg/dL (ref >=40)
LDL Cholesterol (Calc): 131 mg/dL — ABNORMAL HIGH
Non-HDL Cholesterol (Calc): 153 mg/dL — ABNORMAL HIGH (ref <130)
Total CHOL/HDL Ratio: 4.3 (calc) (ref <5.0)
Triglycerides: 117 mg/dL (ref <150)

## 2023-09-12 LAB — TSH: TSH: 1.24 m[IU]/L (ref 0.40–4.50)

## 2023-09-18 ENCOUNTER — Ambulatory Visit (INDEPENDENT_AMBULATORY_CARE_PROVIDER_SITE_OTHER): Payer: 59 | Admitting: Family Medicine

## 2023-09-18 ENCOUNTER — Encounter: Payer: Self-pay | Admitting: Family Medicine

## 2023-09-18 VITALS — BP 104/68 | Ht 71.0 in | Wt 235.0 lb

## 2023-09-18 DIAGNOSIS — R5383 Other fatigue: Secondary | ICD-10-CM

## 2023-09-18 DIAGNOSIS — E7800 Pure hypercholesterolemia, unspecified: Secondary | ICD-10-CM | POA: Diagnosis not present

## 2023-09-18 DIAGNOSIS — R7309 Other abnormal glucose: Secondary | ICD-10-CM | POA: Diagnosis not present

## 2023-09-18 DIAGNOSIS — Z0000 Encounter for general adult medical examination without abnormal findings: Secondary | ICD-10-CM | POA: Diagnosis not present

## 2023-09-18 DIAGNOSIS — G4719 Other hypersomnia: Secondary | ICD-10-CM | POA: Diagnosis not present

## 2023-09-18 NOTE — Patient Instructions (Addendum)
Thank you for coming to the office today.  Consider Prostate medication if cannot resolve the Urinary symptoms by reducing the coffee caffeine intake. Also the A1c elevated pre diabetes can increase urinary frequency.  Saw Palmetto 80mg  up to 160mg  dose up to twice per day for prostate health and improve urinary flow and better complete emptying.  -----------------------------  [facility] Address: [address] [place], [place], [place] [address] Phone: [phone]  Referral sent via fax today. Stay tuned to hear back from them, they should call you to schedule initial sleep study, likely a home test and then if abnormal - they will arrange the 2nd part of the sleep study in the lab with the CPAP machine.  If there is any issue with this company, for example not covered by insurance or other problem, please notify us and they may do so a well - we will need to change the location of the referral.  ------------------    You have been referred for a Coronary Calcium Score Cardiac CT Scan. This is a screening test for patients aged 59-50+ with cardiovascular risk factors or who are healthy but would be interested in Cardiovascular Screening for heart disease. Even if there is a family history of heart disease, this imaging can be useful. Typically it can be done every 5+ years or at a different timeline we agree on  The scan will look at the chest and mainly focus on the heart and identify early signs of calcium build up or blockages within the heart arteries. It is not 100% accurate for identifying blockages or heart disease, but it is useful to help [place] predict who may have some early changes or be at risk in the future for a heart attack or cardiovascular problem.  The results are reviewed by a Cardiologist and they will document the results. It should become available on MyChart. Typically the results are divided into percentiles based on other patients of the same  demographic and age. So it will compare your risk to others similar to you. If you have a higher score >99 or higher percentile >75%tile, it is recommended to consider Statin cholesterol therapy and or referral to Cardiologist. I will try to help explain your results and if we have questions we can contact the Cardiologist.  You will be contacted for scheduling. Usually it is done at any imaging facility through [facility], [facility] or [facility].  The cost is $99 flat fee total and it does not go through insurance, so no authorization is required.    Please schedule a Follow-up Appointment to: Return in about 3 months (around 12/19/2023) for 3 month follow-up Sleep study results, Urinary frequency.  If you have any other questions or concerns, please feel free to call the office or send a message through MyChart. You may also schedule an earlier appointment if necessary.  Additionally, you may be receiving a survey about your experience at our office within a few days to 1 week by e-mail or mail. We value your feedback.  Saralyn Pilar, DO [facility], [place]

## 2023-09-18 NOTE — Progress Notes (Addendum)
Subjective:    Patient ID: Garrett Mcmahon, male    DOB: 06-21-1971, 52 y.o.   MRN: [id number]  Garrett Mcmahon is a 52 y.o. male presenting on 09/18/2023 for Annual Exam, Fatigue (No energy), and Urinary Frequency (Patient and wife have noticed an increase, strong stream)   HPI  Here for Annual Physical and lab Review.  Discussed the use of AI scribe software for clinical note transcription with the patient, who gave verbal consent to proceed.   OBESITY BMI >32 Weight stable +1 lb 1 in 1 year but he did lose weight last year and regained it He is improving his diet reducing carb starch sugar in diet  HYPERLIPIDEMIA: - Reports concerns. Last lipid panel 09/2023 with elevated LDL 131, has improved from 135-150+ Not on statin therapy He did have CT 10/2022, showed normal heart and no other high risk cardiovascular concern. It did show some coronary artery calcification  Elevated A1c Previous result A1c 5.8% (09/2023), prior result 5.7% See above lifestyle Denies hypoglycemia, polyuria, visual changes, numbness or tingling.  Additional concerns:  Excessive Daytime Sleepiness  / Tired Very low energy level He admits feeling poor rest from sleep. Waking up not rested. Sleepy during day, often napping No prior documented sleep apnea. No witnessed sleep apnea. Goes to bed 930-10pm, wake up 6am Rarely waking up in middle of night  Epworth Sleepiness Scale Total Score: 12 Sitting and reading - 3 Watching TV - 3 Sitting inactive in a public place - 1 As a passenger in a car for an hour without a break - 2 Lying down to rest in the afternoon when circumstances permit - 3 Sitting and talking to someone - 0 Sitting quietly after a lunch without alcohol - 0 In a car, while stopped for a few minutes in traffic - 0  STOP-Bang OSA scoring Snoring yes   Tiredness yes   Observed apneas no   Pressure HTN no   BMI > 35 kg/m2 no   Age > 50  yes   Neck (male >17 in; Male >16 in)   yes   Gender male yes   OSA risk low (0-2)  OSA risk intermediate (3-4)  OSA risk high (5+)  Total: 5        Health Maintenance No known fam history of colon or prostate cancer   Prostate CA Screening: Last PSA 0.15 (09/2023) prior range 0.16 - 0.2. Currently with mild weaker stream and nocturia but mostly during day functions well. No known family history of prostate CA.  - Urinary frequency 7-8 times at night before bed. Rare nocturia 0-1 x overnight.  Colon CA Screeningn Cologuard completed 09/01/23 - negative, repeat 3 years, 2027   Due for Flu Shot and COVID19 vaccine, declines today despite counseling on benefits     09/18/2023    1:52 PM 04/11/2023    8:35 AM 02/28/2023    4:17 PM  Depression screen PHQ 2/9  Decreased Interest 0 0 0  Down, Depressed, Hopeless 0 0 0  PHQ - 2 Score 0 0 0  Altered sleeping  0 0  Tired, decreased energy  0 0  Change in appetite  0 0  Feeling bad or failure about yourself   0 0  Trouble concentrating  0 0  Moving slowly or fidgety/restless  0 0  Suicidal thoughts  0 0  PHQ-9 Score  0 0  Difficult doing work/chores  Not difficult at all Not difficult at all  Past Medical History:  Diagnosis Date   Family history of MI (myocardial infarction) 01/18/2017   Age- 49's   H/O shoulder surgery    Inguinal hernia 1971   Repaired at 2 months old    Tobacco use    Past Surgical History:  Procedure Laterality Date   INGUINAL HERNIA REPAIR Right 1971   Repaired at 90 months old   KNEE ARTHROSCOPY Right    x2   KNEE ARTHROSCOPY W/ ACL RECONSTRUCTION Right    Social History   Socioeconomic History   Marital status: Married    Spouse name: Not on file   Number of children: Not on file   Years of education: High School   Highest education level: High school graduate  Occupational History   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  Tobacco Use   Smoking status: Never   Smokeless tobacco: Current    Types: Chew   Tobacco comments:    1 can  dip per day for 26+ years  Vaping Use   Vaping status: Never Used  Substance and Sexual Activity   Alcohol use: No   Drug use: No   Sexual activity: Not on file  Other Topics Concern   Not on file  Social History Narrative   Not on file   Social Determinants of Health   Financial Resource Strain: Not on file  Food Insecurity: Not on file  Transportation Needs: Not on file  Physical Activity: Not on file  Stress: Not on file  Social Connections: Not on file  Intimate Partner Violence: Not on file   Family History  Problem Relation Age of Onset   COPD Mother    Bipolar disorder Father    Alcohol abuse Father    Heart attack Maternal Grandmother    Breast cancer Maternal Grandmother    Heart disease Maternal Grandmother    Heart disease Paternal Grandfather    Heart attack Paternal Grandfather    Lung cancer Paternal Grandfather        Smoker   Cancer Paternal Grandfather        Lung   Heart disease Maternal Uncle    Cancer Paternal Grandmother        breast   Prostate cancer Neg Hx    Colon cancer Neg Hx    No current outpatient medications on file prior to visit.   No current facility-administered medications on file prior to visit.    Review of Systems  Constitutional:  Negative for activity change, appetite change, chills, diaphoresis, fatigue and fever.  HENT:  Negative for congestion and hearing loss.   Eyes:  Negative for visual disturbance.  Respiratory:  Negative for cough, chest tightness, shortness of breath and wheezing.   Cardiovascular:  Negative for chest pain, palpitations and leg swelling.  Gastrointestinal:  Negative for abdominal pain, constipation, diarrhea, nausea and vomiting.  Genitourinary:  Negative for dysuria, frequency and hematuria.  Musculoskeletal:  Negative for arthralgias and neck pain.  Skin:  Negative for rash.  Neurological:  Negative for dizziness, weakness, light-headedness, numbness and headaches.  Hematological:  Negative  for adenopathy.  Psychiatric/Behavioral:  Negative for behavioral problems, dysphoric mood and sleep disturbance.    Per HPI unless specifically indicated above      Objective:    BP 104/68   Ht 5\' 11"  (1.803 m)   Wt 235 lb (106.6 kg)   BMI 32.78 kg/m   Wt Readings from Last 3 Encounters:  09/18/23 235 lb (106.6 kg)  04/11/23 226 lb  12.8 oz (102.9 kg)  02/28/23 229 lb 3.2 oz (104 kg)    Physical Exam Vitals and nursing note reviewed.  Constitutional:      General: He is not in acute distress.    Appearance: He is well-developed. He is not diaphoretic.     Comments: Well-appearing, comfortable, cooperative  HENT:     Head: Normocephalic and atraumatic.  Eyes:     General:        Right eye: No discharge.        Left eye: No discharge.     Conjunctiva/sclera: Conjunctivae normal.     Pupils: Pupils are equal, round, and reactive to light.  Neck:     Thyroid: No thyromegaly.     Vascular: No carotid bruit.  Cardiovascular:     Rate and Rhythm: Normal rate and regular rhythm.     Pulses: Normal pulses.     Heart sounds: Normal heart sounds. No murmur heard. Pulmonary:     Effort: Pulmonary effort is normal. No respiratory distress.     Breath sounds: Normal breath sounds. No wheezing or rales.  Abdominal:     General: Bowel sounds are normal. There is no distension.     Palpations: Abdomen is soft. There is no mass.     Tenderness: There is no abdominal tenderness.  Musculoskeletal:        General: No tenderness. Normal range of motion.     Cervical back: Normal range of motion and neck supple.     Right lower leg: No edema.     Left lower leg: No edema.     Comments: Upper / Lower Extremities: - Normal muscle tone, strength bilateral upper extremities 5/5, lower extremities 5/5  Lymphadenopathy:     Cervical: No cervical adenopathy.  Skin:    General: Skin is warm and dry.     Findings: No erythema or rash.  Neurological:     Mental Status: He is alert and  oriented to person, place, and time.     Comments: Distal sensation intact to light touch all extremities  Psychiatric:        Mood and Affect: Mood normal.        Behavior: Behavior normal.        Thought Content: Thought content normal.     Comments: Well groomed, good eye contact, normal speech and thoughts       Results for orders placed or performed in visit on 09/11/23  TSH  Result Value Ref Range   TSH 1.24 0.40 - 4.50 mIU/L  PSA  Result Value Ref Range   PSA 0.15 < OR = 4.00 ng/mL  Hemoglobin A1c  Result Value Ref Range   Hgb A1c MFr Bld 5.8 (H) <5.7 % of total Hgb   Mean Plasma Glucose 120 mg/dL   eAG (mmol/L) 6.6 mmol/L  Lipid panel  Result Value Ref Range   Cholesterol 200 (H) <200 mg/dL   HDL 47 > OR = 40 mg/dL   Triglycerides 409 <811 mg/dL   LDL Cholesterol (Calc) 131 (H) mg/dL (calc)   Total CHOL/HDL Ratio 4.3 <5.0 (calc)   Non-HDL Cholesterol (Calc) 153 (H) <130 mg/dL (calc)  CBC with Differential/Platelet  Result Value Ref Range   WBC 6.3 3.8 - 10.8 Thousand/uL   RBC 5.01 4.20 - 5.80 Million/uL   Hemoglobin 16.2 13.2 - 17.1 g/dL   HCT 91.4 78.2 - 95.6 %   MCV 95.4 80.0 - 100.0 fL   MCH 32.3 27.0 -  33.0 pg   MCHC 33.9 32.0 - 36.0 g/dL   RDW 14.7 82.9 - 56.2 %   Platelets 265 140 - 400 Thousand/uL   MPV 10.9 7.5 - 12.5 fL   Neutro Abs 2,993 1,500 - 7,800 cells/uL   Absolute Lymphocytes 2,142 850 - 3,900 cells/uL   Absolute Monocytes 743 200 - 950 cells/uL   Eosinophils Absolute 372 15 - 500 cells/uL   Basophils Absolute 50 0 - 200 cells/uL   Neutrophils Relative % 47.5 %   Total Lymphocyte 34.0 %   Monocytes Relative 11.8 %   Eosinophils Relative 5.9 %   Basophils Relative 0.8 %  COMPLETE METABOLIC PANEL WITH GFR  Result Value Ref Range   Glucose, Bld 91 65 - 99 mg/dL   BUN 9 7 - 25 mg/dL   Creat 1.30 8.65 - 7.84 mg/dL   eGFR 82 > OR = 60 ON/GEX/5.28U1   BUN/Creatinine Ratio SEE NOTE: 6 - 22 (calc)   Sodium 140 135 - 146 mmol/L   Potassium  4.6 3.5 - 5.3 mmol/L   Chloride 105 98 - 110 mmol/L   CO2 29 20 - 32 mmol/L   Calcium 9.1 8.6 - 10.3 mg/dL   Total Protein 6.6 6.1 - 8.1 g/dL   Albumin 4.0 3.6 - 5.1 g/dL   Globulin 2.6 1.9 - 3.7 g/dL (calc)   AG Ratio 1.5 1.0 - 2.5 (calc)   Total Bilirubin 0.4 0.2 - 1.2 mg/dL   Alkaline phosphatase (APISO) 62 35 - 144 U/L   AST 17 10 - 35 U/L   ALT 21 9 - 46 U/L      Assessment & Plan:   Problem List Items Addressed This Visit     Elevated hemoglobin A1c    A1c stable 5.8 range prior 5.7-5.8 Concern with obesity, HTN, HLD  Plan:  1. Not on any therapy currently  2. Encourage improved lifestyle - low carb, low sugar diet, reduce portion size, continue improving regular exercise      Hyperlipidemia    Stable LDL 131, slight improved. Despite lifestyle intervention Not on statin The 10-year ASCVD risk score (Arnett DK, et al., 2019) is: 3.4%   Plan: 1. Not on medication, not indicated at this time 2. Encourage improved lifestyle - low carb/cholesterol, reduce portion size, continue improving regular exercise  Ordered CT Coronary Calcium Score for further detailed eval and risk stratification. Prior CT for chest showed calcification coronary arteries      Relevant Orders   CT CARDIAC SCORING (SELF PAY ONLY)   Other Visit Diagnoses     Annual physical exam    -  Primary   Excessive daytime sleepiness       Fatigue, unspecified type           Updated Health Maintenance information Reviewed recent lab results with patient Encouraged improvement to lifestyle with diet and exercise Goal of weight loss   Colon Cancer Screening Cologuard completed on 09/01/2023 with negative results. -Next Cologuard due in October 2027.  Frequent Urination Reports frequent urination, particularly in the evening. No nocturia. High caffeine intake noted w/ coffee  -Advise to scale back caffeine intake and attempt regular emptying of bladder. -Consider trial of saw palmetto for  potential prostate-related symptoms if no improvement with caffeine reduction.  Excessive Daytime Sleepiness Reports significant fatigue and sleepiness, with some days feeling unrested upon waking. No known sleep apnea. -Order sleep study to evaluate for potential sleep disorders. -Follow-up in 3 months to discuss results and  potential treatment options.     New problem with persistent clinical concern for suspected obstructive sleep apnea given reported symptoms without witnessed apnea, snoring. He has documented sleep disturbance on occasion, with excessive daytime sleepiness fatigue tired - Screening: ESS score 12 / STOP-Bang Score 5  - Neck Circumference: >17"  Plan: 1. Discussion on initial diagnosis and testing for OSA, risk factors, management, complications 2. Agree to proceed with sleep study testing based on clinical concerns - will fax referral to [facility] - to arrange initial PSG home vs in sleep lab and further evaluation.     Orders Placed This Encounter  Procedures   CT CARDIAC SCORING (SELF PAY ONLY)    Standing Status:   Future    Standing Expiration Date:   09/17/2024    Order Specific Question:   Preferred imaging location?    Answer:   [facility]       Orders Placed This Encounter  Procedures   CT CARDIAC SCORING (SELF PAY ONLY)    Standing Status:   Future    Standing Expiration Date:   09/17/2024    Order Specific Question:   Preferred imaging location?    Answer:   [facility]    No orders of the defined types were placed in this encounter.     Follow up plan: Return in about 3 months (around 12/19/2023) for 3 month follow-up Sleep study results, Urinary frequency.  Saralyn Pilar, DO [facility] 09/18/2023, 1:59 PM

## 2023-09-18 NOTE — Assessment & Plan Note (Signed)
A1c stable 5.8 range prior 5.7-5.8 Concern with obesity, HTN, HLD  Plan:  1. Not on any therapy currently  2. Encourage improved lifestyle - low carb, low sugar diet, reduce portion size, continue improving regular exercise

## 2023-09-18 NOTE — Assessment & Plan Note (Signed)
Stable LDL 131, slight improved. Despite lifestyle intervention Not on statin The 10-year ASCVD risk score (Arnett DK, et al., 2019) is: 3.4%   Plan: 1. Not on medication, not indicated at this time 2. Encourage improved lifestyle - low carb/cholesterol, reduce portion size, continue improving regular exercise  Ordered CT Coronary Calcium Score for further detailed eval and risk stratification. Prior CT for chest showed calcification coronary arteries

## 2023-09-25 ENCOUNTER — Telehealth: Payer: Self-pay

## 2023-09-25 DIAGNOSIS — G4719 Other hypersomnia: Secondary | ICD-10-CM

## 2023-09-25 DIAGNOSIS — R29818 Other symptoms and signs involving the nervous system: Secondary | ICD-10-CM

## 2023-09-25 NOTE — Telephone Encounter (Signed)
Routing this to [facility] for review  Please refer to last office visit 09/18/23 for notes on excessive daytime sleepiness  We attempted to refer to [facility] but as you see stated here it was unsuccessful.  Can you process a new referral to [facility]? Or check if they're in network first?  Let me know if you need anything else.  Order is in  Saralyn Pilar, DO [facility] 09/25/2023, 4:48 PM

## 2023-09-25 NOTE — Telephone Encounter (Signed)
[facility] unable to schedule patient due to finances and change of insurance. Please let me know if I need to do anything further. Thanks!

## 2023-09-25 NOTE — Addendum Note (Signed)
Addended by: Smitty Cords on: 09/25/2023 04:48 PM   Modules accepted: Orders

## 2023-10-30 ENCOUNTER — Other Ambulatory Visit: Payer: Medicaid Other

## 2023-11-21 ENCOUNTER — Encounter: Payer: Self-pay | Admitting: Internal Medicine

## 2023-11-21 ENCOUNTER — Ambulatory Visit: Payer: Medicaid Other | Admitting: Internal Medicine

## 2023-11-21 VITALS — BP 118/78 | HR 95 | Temp 98.4°F | Ht 71.0 in | Wt 237.0 lb

## 2023-11-21 DIAGNOSIS — J069 Acute upper respiratory infection, unspecified: Secondary | ICD-10-CM | POA: Diagnosis not present

## 2023-11-21 LAB — POC COVID19/FLU A&B COMBO
Covid Antigen, POC: NEGATIVE
Influenza A Antigen, POC: NEGATIVE
Influenza B Antigen, POC: NEGATIVE

## 2023-11-21 MED ORDER — IPRATROPIUM BROMIDE 0.06 % NA SOLN: 2.0000 | Freq: Four times a day (QID) | NASAL | 0 refills | Status: DC

## 2023-11-21 MED ORDER — BENZONATATE 200 MG PO CAPS: 200.0000 mg | ORAL_CAPSULE | Freq: Three times a day (TID) | ORAL | 0 refills | Status: DC | PRN

## 2023-11-21 NOTE — Patient Instructions (Signed)

## 2023-11-21 NOTE — Progress Notes (Signed)
 Subjective:    Patient ID: Garrett Mcmahon, male    DOB: [birth year], 53 y.o.   MRN: [id number]  HPI  Discussed the use of AI scribe software for clinical note transcription with the patient, who gave verbal consent to proceed.   The patient presented with symptoms of a respiratory illness that began the previous day. He reported experiencing body aches, coughing, and both chest and nasal congestion. The patient noted difficulty catching his breath upon waking due to mucus production. Despite these symptoms, he denied having a fever.  The patient also reported a mild sore throat and a slightly runny nose, with occasional sneezing. He mentioned increased gas production but denied any nausea, vomiting, or diarrhea. The patient's son had a severe cough the previous week, which has since resolved.  The patient had been self-medicating with an over-the-counter medication similar to DayQuil, which initially seemed effective but became less so as the day progressed. The patient sought medical attention due to a previous experience where similar symptoms developed into pneumonia. He expressed a desire to be proactive in managing his symptoms this time.       Review of Systems   Past Medical History:  Diagnosis Date   Family history of MI (myocardial infarction) 01/18/2017   Age- 82's   H/O shoulder surgery    Inguinal hernia 1971   Repaired at 2 months old    Tobacco use     No current outpatient medications on file.   No current facility-administered medications for this visit.    No Known Allergies  Family History  Problem Relation Age of Onset   COPD Mother    Bipolar disorder Father    Alcohol abuse Father    Heart attack Maternal Grandmother    Breast cancer Maternal Grandmother    Heart disease Maternal Grandmother    Heart disease Paternal Grandfather    Heart attack Paternal Grandfather    Lung cancer Paternal Grandfather        Smoker   Cancer Paternal Grandfather         Lung   Heart disease Maternal Uncle    Cancer Paternal Grandmother        breast   Prostate cancer Neg Hx    Colon cancer Neg Hx     Social History   Socioeconomic History   Marital status: Married    Spouse name: Not on file   Number of children: Not on file   Years of education: High School   Highest education level: High school graduate  Occupational History   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  Tobacco Use   Smoking status: Never   Smokeless tobacco: Current    Types: Chew   Tobacco comments:    1 can dip per day for 26+ years  Vaping Use   Vaping status: Never Used  Substance and Sexual Activity   Alcohol use: No   Drug use: No   Sexual activity: Not on file  Other Topics Concern   Not on file  Social History Narrative   Not on file   Social Drivers of Health   Financial Resource Strain: Not on file  Food Insecurity: Not on file  Transportation Needs: Not on file  Physical Activity: Not on file  Stress: Not on file  Social Connections: Not on file  Intimate Partner Violence: Not on file     Constitutional: Pt reports fatigue. Denies fever, malaise, headache or abrupt weight changes.  HEENT: Pt reports runny nose,  nasal congestion and sore throat. Denies eye pain, eye redness, ear pain, ringing in the ears, wax buildup, runny nose, bloody nose. Respiratory: Pt reports cough and shortness of breath. Denies difficulty breathing.   Cardiovascular: Denies chest pain, chest tightness, palpitations or swelling in the hands or feet.  Gastrointestinal: Denies abdominal pain, bloating, constipation, diarrhea or blood in the stool.  GU: Denies urgency, frequency, pain with urination, burning sensation, blood in urine, odor or discharge. Musculoskeletal: Pt reports body aches. Denies decrease in range of motion, difficulty with gait, or joint swelling.  Skin: Denies redness, rashes, lesions or ulcercations.    No other specific complaints in a complete review of  systems (except as listed in HPI above).      Objective:   Physical Exam  BP 118/78 (BP Location: Left Arm, Patient Position: Sitting, Cuff Size: Large)   Pulse 95   Temp 98.4 F (36.9 C)   Ht 5' 11 (1.803 m)   Wt 237 lb (107.5 kg)   SpO2 97%   BMI 33.05 kg/m   Wt Readings from Last 3 Encounters:  09/18/23 235 lb (106.6 kg)  04/11/23 226 lb 12.8 oz (102.9 kg)  02/28/23 229 lb 3.2 oz (104 kg)    General: Appears his stated age, obese, in NAD. Skin: Warm, dry and intact. No rashes noted. HEENT: Head: normal shape and size, no sinus pressure noted.; Eyes: sclera white, no icterus, conjunctiva pink, PERRLA and EOMs intact;  Nose: mucosa pink and moist, septum midline; Throat/Mouth: Teeth present, mucosa erythematous and moist, + PND, no exudate, lesions or ulcerations noted.  Neck:  No adenopathy noted.  Cardiovascular: Normal rate and rhythm. S1,S2 noted.  No murmur, rubs or gallops noted. No JVD or BLE edema. No carotid bruits noted. Pulmonary/Chest: Normal effort and positive vesicular breath sounds. No respiratory distress. No wheezes, rales or ronchi noted.   Neurological: Alert and oriented.   BMET    Component Value Date/Time   NA 140 09/11/2023 0805   K 4.6 09/11/2023 0805   CL 105 09/11/2023 0805   CO2 29 09/11/2023 0805   GLUCOSE 91 09/11/2023 0805   BUN 9 09/11/2023 0805   CREATININE 1.08 09/11/2023 0805   CALCIUM 9.1 09/11/2023 0805   GFRNONAA >60 10/22/2022 2230   GFRNONAA 86 04/07/2020 0830   GFRAA 100 04/07/2020 0830    Lipid Panel     Component Value Date/Time   CHOL 200 (H) 09/11/2023 0805   TRIG 117 09/11/2023 0805   HDL 47 09/11/2023 0805   CHOLHDL 4.3 09/11/2023 0805   LDLCALC 131 (H) 09/11/2023 0805    CBC    Component Value Date/Time   WBC 6.3 09/11/2023 0805   RBC 5.01 09/11/2023 0805   HGB 16.2 09/11/2023 0805   HCT 47.8 09/11/2023 0805   PLT 265 09/11/2023 0805   MCV 95.4 09/11/2023 0805   MCH 32.3 09/11/2023 0805   MCHC 33.9  09/11/2023 0805   RDW 12.4 09/11/2023 0805   LYMPHSABS 2.0 10/22/2022 2230   MONOABS 1.0 10/22/2022 2230   EOSABS 372 09/11/2023 0805   BASOSABS 50 09/11/2023 0805    Hgb A1C Lab Results  Component Value Date   HGBA1C 5.8 (H) 09/11/2023            Assessment & Plan:   Assessment and Plan    Viral Upper Respiratory Infection With Cough Acute onset of body aches, cough, nasal and chest congestion, and sore throat. No fever, headache, or gastrointestinal symptoms. Negative for  COVID and flu. History of pneumonia last year. -Start Atrovent  nasal spray for nasal congestion. -Can use antihistamine such as Claritin, Allegra or Zyrtec  for postnasal drip -Prescribe Tessalon  Perles for cough, three times a day. -If not improved by the end of the week, consider starting prednisone  or antibiotics.    Follow-up with your PCP as previously scheduled Angeline Laura, NP

## 2023-12-18 DIAGNOSIS — H5213 Myopia, bilateral: Secondary | ICD-10-CM | POA: Diagnosis not present

## 2023-12-20 ENCOUNTER — Telehealth: Payer: Self-pay

## 2023-12-20 NOTE — Telephone Encounter (Signed)
Looks like patient is scheduled for a follow up on 2/17. May just need urine sample. Will patient need any other labs before we call and advise? Thanks!

## 2023-12-20 NOTE — Telephone Encounter (Signed)
There are no labs that I am recommending before his visit. Instructions on previous note from his Physical in 09/2023.  We were going to consider a Urine test to follow-up prostate but that would be at the appointment, not before.  If we need A1c sugar we can discuss it at the appointment but again it was not planned for this visit.  Saralyn Pilar, DO [facility] 12/20/2023, 1:09 PM

## 2023-12-20 NOTE — Telephone Encounter (Signed)
Copied from CRM [id number]. Topic: Appointment Scheduling - Scheduling Inquiry for Clinic >> Dec 20, 2023  8:55 AM Tiffany B wrote: Reason for CRM: Patient would like to know if PCP would like him to have labs prior to his  2/17 follow up appointment, Caller would like a follow up call

## 2023-12-25 ENCOUNTER — Encounter: Payer: Self-pay | Admitting: Family Medicine

## 2023-12-25 ENCOUNTER — Ambulatory Visit: Payer: Medicaid Other | Admitting: Family Medicine

## 2023-12-25 VITALS — BP 122/70 | HR 84 | Ht 71.0 in | Wt 234.0 lb

## 2023-12-25 DIAGNOSIS — J111 Influenza due to unidentified influenza virus with other respiratory manifestations: Secondary | ICD-10-CM | POA: Diagnosis not present

## 2023-12-25 MED ORDER — OSELTAMIVIR PHOSPHATE 75 MG PO CAPS: 75.0000 mg | ORAL_CAPSULE | Freq: Two times a day (BID) | ORAL | 0 refills | Status: AC

## 2023-12-25 NOTE — Patient Instructions (Addendum)
 Thank you for coming to the office today.  Your flu test was NEGATIVE, this is not 100% though, and you can still have the flu with a negative test, otherwise it could be a different viral syndrome.  - Start Flu medicine today Start Tamiflu (anti-flu medicine) take one capsule 75mg  twice a day  - For symptom control (these are optional OTC medicines)      - Take Ibuprofen / Advil 400-600mg  every 6-8 hours as needed for fever / muscle aches, and may also take Tylenol 500-1000mg  per dose every 6-8 hours or 3 times a day, can alternate dosing     - May try OTC Mucinex up to 7-10 days then stop  - Wash hands and cover cough very well to avoid spread of infection - Improve hydration with plenty of clear fluids   If significant worsening with poor fluid intake, worsening fever, difficulty breathing due to coughing, worsening body aches, weakness, or other more concerning symptoms difficulty breathing you can seek treatment at Emergency Department. Also if improved flu symptoms and then worsening days to week later with concerns for bronchitis, productive cough fever chills again we may need to check for possible pneumonia that can occur after the flu     Please schedule a Follow-up Appointment to: Return if symptoms worsen or fail to improve.  If you have any other questions or concerns, please feel free to call the office or send a message through MyChart. You may also schedule an earlier appointment if necessary.  Additionally, you may be receiving a survey about your experience at our office within a few days to 1 week by e-mail or mail. We value your feedback.  Saralyn Pilar, DO [facility], [place]

## 2023-12-25 NOTE — Progress Notes (Signed)
 Subjective:    Patient ID: Garrett Mcmahon, male    DOB: [birth year], 53 y.o.   MRN: [id number]  KACE HARTJE is a 53 y.o. male presenting on 12/25/2023 for Influenza   HPI  Discussed the use of AI scribe software for clinical note transcription with the patient, who gave verbal consent to proceed.  History of Present Illness   Garrett Mcmahon is a 53 year old male who presents with flu-like symptoms after exposure to his son who tested positive for the flu.  Symptoms began on Saturday with body aches and a low-grade fever, persisting into Sunday. He also experiences coughing, sneezing, and mucus production, though the mucus lacks a distinctive taste. Over-the-counter cold and flu medications have provided some relief. He has not previously taken Tamiflu but notes that his son was prescribed it and experienced rapid improvement. He has been using cough lozenges, which have helped alleviate his symptoms.  Regarding sleep, he reports improvement after changing his bedtime routine, specifically by reducing screen time before sleep. Consequently, he decided not to proceed with a previously scheduled sleep study.  He mentions a past issue with urinary frequency and urgency, which has improved after reducing caffeine intake and increasing the frequency of bathroom visits.         12/25/2023    1:49 PM 09/18/2023    1:52 PM 04/11/2023    8:35 AM  Depression screen PHQ 2/9  Decreased Interest 0 0 0  Down, Depressed, Hopeless 0 0 0  PHQ - 2 Score 0 0 0  Altered sleeping   0  Tired, decreased energy   0  Change in appetite   0  Feeling bad or failure about yourself    0  Trouble concentrating   0  Moving slowly or fidgety/restless   0  Suicidal thoughts   0  PHQ-9 Score   0  Difficult doing work/chores   Not difficult at all       11 /09/2023    1:52 PM 02/28/2023    4:17 PM 01/26/2022   10:01 AM 08/26/2021   10:57 AM  GAD 7 : Generalized Anxiety Score  Nervous, Anxious, on Edge 0 0 0  1  Control/stop worrying 0 0 0 1  Worry too much - different things 0 0 0 1  Trouble relaxing 0 0 0 1  Restless 0 0 1 1  Easily annoyed or irritable 0 0 1 2  Afraid - awful might happen 0 0 0 0  Total GAD 7 Score 0 0 2 7  Anxiety Difficulty  Not difficult at all  Not difficult at all    Social History   Tobacco Use   Smoking status: Never   Smokeless tobacco: Current    Types: Chew   Tobacco comments:    1 can dip per day for 26+ years  Vaping Use   Vaping status: Never Used  Substance Use Topics   Alcohol use: No   Drug use: No    Review of Systems Per HPI unless specifically indicated above     Objective:    BP 122/70   Pulse 84   Ht 5\' 11"  (1.803 m)   Wt 234 lb (106.1 kg)   SpO2 94%   BMI 32.64 kg/m   Wt Readings from Last 3 Encounters:  12/25/23 234 lb (106.1 kg)  11/21/23 237 lb (107.5 kg)  09/18/23 235 lb (106.6 kg)    Physical Exam Vitals and nursing note reviewed.  Constitutional:      General: He is not in acute distress.    Appearance: He is well-developed. He is not diaphoretic.     Comments: Well-appearing, comfortable, cooperative  HENT:     Head: Normocephalic and atraumatic.  Eyes:     General:        Right eye: No discharge.        Left eye: No discharge.     Conjunctiva/sclera: Conjunctivae normal.  Neck:     Thyroid: No thyromegaly.  Cardiovascular:     Rate and Rhythm: Normal rate and regular rhythm.     Pulses: Normal pulses.     Heart sounds: Normal heart sounds. No murmur heard. Pulmonary:     Effort: Pulmonary effort is normal. No respiratory distress.     Breath sounds: Normal breath sounds. No wheezing or rales.  Musculoskeletal:        General: Normal range of motion.     Cervical back: Normal range of motion and neck supple.  Lymphadenopathy:     Cervical: No cervical adenopathy.  Skin:    General: Skin is warm and dry.     Findings: No erythema or rash.  Neurological:     Mental Status: He is alert and oriented to  person, place, and time. Mental status is at baseline.  Psychiatric:        Behavior: Behavior normal.     Comments: Well groomed, good eye contact, normal speech and thoughts     Results for orders placed or performed in visit on 11/21/23  POC Covid19/Flu A&B Antigen   Collection Time: 11/21/23  9:35 AM  Result Value Ref Range   Influenza A Antigen, POC Negative Negative   Influenza B Antigen, POC Negative Negative   Covid Antigen, POC Negative Negative      Assessment & Plan:   Problem List Items Addressed This Visit   None Visit Diagnoses       Influenza    -  Primary   Relevant Medications   oseltamivir (TAMIFLU) 75 MG capsule        Influenza A Recent exposure to son with confirmed influenza. Symptoms include fever, body aches, cough, and fatigue. Rapid influenza test negative, but clinical suspicion remains high given symptoms and exposure. -Prescribe Tamiflu, twice daily for five days.  Sleep Disturbance Improvement in sleep quality with behavioral modifications, including reducing screen time before bed and not waiting to urinate. Did not pursue sleep study when previously ordered. No longer needed -No further intervention needed at this time.  Urinary Frequency Improvement noted with increased frequency of urination and reduction in caffeine intake. -No further intervention needed at this time.         No orders of the defined types were placed in this encounter.   Meds ordered this encounter  Medications   oseltamivir (TAMIFLU) 75 MG capsule    Sig: Take 1 capsule (75 mg total) by mouth 2 (two) times daily. For 5 days    Dispense:  10 capsule    Refill:  0    Follow up plan: Return if symptoms worsen or fail to improve.   Saralyn Pilar, DO [facility] 12/25/2023, 1:37 PM

## 2024-02-21 ENCOUNTER — Encounter: Payer: Self-pay | Admitting: Dermatology

## 2024-02-21 ENCOUNTER — Ambulatory Visit (INDEPENDENT_AMBULATORY_CARE_PROVIDER_SITE_OTHER): Payer: 59 | Admitting: Dermatology

## 2024-02-21 DIAGNOSIS — L814 Other melanin hyperpigmentation: Secondary | ICD-10-CM | POA: Diagnosis not present

## 2024-02-21 DIAGNOSIS — W908XXA Exposure to other nonionizing radiation, initial encounter: Secondary | ICD-10-CM

## 2024-02-21 DIAGNOSIS — L820 Inflamed seborrheic keratosis: Secondary | ICD-10-CM | POA: Diagnosis not present

## 2024-02-21 DIAGNOSIS — D225 Melanocytic nevi of trunk: Secondary | ICD-10-CM | POA: Diagnosis not present

## 2024-02-21 DIAGNOSIS — Z1283 Encounter for screening for malignant neoplasm of skin: Secondary | ICD-10-CM

## 2024-02-21 DIAGNOSIS — L720 Epidermal cyst: Secondary | ICD-10-CM | POA: Diagnosis not present

## 2024-02-21 DIAGNOSIS — D229 Melanocytic nevi, unspecified: Secondary | ICD-10-CM

## 2024-02-21 DIAGNOSIS — L578 Other skin changes due to chronic exposure to nonionizing radiation: Secondary | ICD-10-CM | POA: Diagnosis not present

## 2024-02-21 DIAGNOSIS — L821 Other seborrheic keratosis: Secondary | ICD-10-CM

## 2024-02-21 DIAGNOSIS — D1801 Hemangioma of skin and subcutaneous tissue: Secondary | ICD-10-CM | POA: Diagnosis not present

## 2024-02-21 DIAGNOSIS — L729 Follicular cyst of the skin and subcutaneous tissue, unspecified: Secondary | ICD-10-CM

## 2024-02-21 DIAGNOSIS — D492 Neoplasm of unspecified behavior of bone, soft tissue, and skin: Secondary | ICD-10-CM | POA: Diagnosis not present

## 2024-02-21 NOTE — Patient Instructions (Addendum)

## 2024-02-21 NOTE — Progress Notes (Signed)
 New Patient Visit   Subjective  Garrett Mcmahon is a 53 y.o. male who presents for the following: Skin Cancer Screening and Upper Body Skin Exam, no hx of skin cancer, no fhx of skin cancer  The patient presents for Upper Body Skin Exam (UBSE) for skin cancer screening and mole check. The patient has spots, moles and lesions to be evaluated, some may be new or changing and the patient may have concern these could be cancer.  New patient referral from Dr. Jacklin Mascot.  The following portions of the chart were reviewed this encounter and updated as appropriate: medications, allergies, medical history  Review of Systems:  No other skin or systemic complaints except as noted in HPI or Assessment and Plan.  Objective  Well appearing patient in no apparent distress; mood and affect are within normal limits.  All skin waist up examined. Relevant physical exam findings are noted in the Assessment and Plan.  R lat abdomen 0.6 x 0.3cm irregular brown macule  L ant neck x 1, L lat infrapectoral x 1 (2) Stuck on waxy paps with erythema  Assessment & Plan   NEOPLASM OF SKIN R lat abdomen Epidermal / dermal shaving  Lesion diameter (cm):  0.6 Informed consent: discussed and consent obtained   Timeout: patient name, date of birth, surgical site, and procedure verified   Procedure prep:  Patient was prepped and draped in usual sterile fashion Prep type:  Isopropyl alcohol Anesthesia: the lesion was anesthetized in a standard fashion   Anesthetic:  1% lidocaine w/ epinephrine 1-100,000 buffered w/ 8.4% NaHCO3 Instrument used: flexible razor blade   Hemostasis achieved with: pressure, aluminum chloride and electrodesiccation   Outcome: patient tolerated procedure well   Post-procedure details: sterile dressing applied and wound care instructions given   Dressing type: bandage and petrolatum   Specimen 1 - Surgical pathology Differential Diagnosis: D48.5 Nevus vs Dysplastic Nevus  Check  Margins: yes 0.6 x 0.3cm irregular brown macule INFLAMED SEBORRHEIC KERATOSIS (2) L ant neck x 1, L lat infrapectoral x 1 (2) Symptomatic, irritating, patient would like treated. Destruction of lesion - L ant neck x 1, L lat infrapectoral x 1 (2) Complexity: simple   Destruction method: cryotherapy   Informed consent: discussed and consent obtained   Timeout:  patient name, date of birth, surgical site, and procedure verified Lesion destroyed using liquid nitrogen: Yes   Region frozen until ice ball extended beyond lesion: Yes   Outcome: patient tolerated procedure well with no complications   Post-procedure details: wound care instructions given     Skin cancer screening performed today.  Actinic Damage - Chronic condition, secondary to cumulative UV/sun exposure - diffuse scaly erythematous macules with underlying dyspigmentation - Recommend daily broad spectrum sunscreen SPF 30+ to sun-exposed areas, reapply every 2 hours as needed.  - Staying in the shade or wearing long sleeves, sun glasses (UVA+UVB protection) and wide brim hats (4-inch brim around the entire circumference of the hat) are also recommended for sun protection.  - Call for new or changing lesions.  Lentigines, Seborrheic Keratoses, Hemangiomas - Benign normal skin lesions - Benign-appearing - Call for any changes -Sks eyelid, trunk  Melanocytic Nevi - Tan-brown and/or pink-flesh-colored symmetric macules and papules - Benign appearing on exam today - Observation - Call clinic for new or changing moles - Recommend daily use of broad spectrum spf 30+ sunscreen to sun-exposed areas.   Milia R medial canthus, scalp - tiny firm white papules - type of cyst -  benign - sometimes these will clear with nightly OTC adapalene/Differin 0.1% gel or retinol. - may be extracted if symptomatic - observe  Return in about 1 year (around 02/20/2025) for TBSE.  I, Rollie Clipper, RMA, am acting as scribe for Celine Collard, MD .   Documentation: I have reviewed the above documentation for accuracy and completeness, and I agree with the above.  Celine Collard, MD

## 2024-02-29 ENCOUNTER — Encounter: Payer: Self-pay | Admitting: Dermatology

## 2024-02-29 LAB — SURGICAL PATHOLOGY

## 2024-03-04 ENCOUNTER — Telehealth: Payer: Self-pay

## 2024-03-04 NOTE — Telephone Encounter (Signed)
-----   Message from Celine Collard sent at 02/29/2024  5:24 PM EDT ----- FINAL DIAGNOSIS        1. Skin, R lat abdomen :       DYSPLASTIC COMPOUND NEVUS WITH MODERATE ATYPIA, LIMITED MARGINS FREE   Moderate dysplastic Recheck next visit

## 2024-03-04 NOTE — Telephone Encounter (Signed)
 Discussed biopsy results with patient  Skin, R lat abdomen :       DYSPLASTIC COMPOUND NEVUS WITH MODERATE ATYPIA, LIMITED MARGINS FREE    Moderate dysplastic Recheck next visit

## 2025-02-26 ENCOUNTER — Ambulatory Visit: Admitting: Dermatology
